# Patient Record
Sex: Male | Born: 1955
Health system: Southern US, Community
[De-identification: ages and names within clinical notes are randomized; demographics above are authoritative.]

## PROBLEM LIST (undated history)

## (undated) DIAGNOSIS — I10 Essential (primary) hypertension: Secondary | ICD-10-CM

## (undated) DIAGNOSIS — E785 Hyperlipidemia, unspecified: Secondary | ICD-10-CM

## (undated) HISTORY — DX: Hyperlipidemia, unspecified: E78.5

## (undated) HISTORY — DX: Essential (primary) hypertension: I10

## (undated) HISTORY — PX: OTHER SURGICAL HISTORY: SHX169

---

## 2005-08-24 ENCOUNTER — Emergency Department (HOSPITAL_COMMUNITY): Admission: EM | Admit: 2005-08-24 | Discharge: 2005-08-24 | Payer: Self-pay | Admitting: Emergency Medicine

## 2010-10-18 ENCOUNTER — Inpatient Hospital Stay (INDEPENDENT_AMBULATORY_CARE_PROVIDER_SITE_OTHER)
Admission: RE | Admit: 2010-10-18 | Discharge: 2010-10-18 | Disposition: A | Discharge status: Home / Self Care | Payer: BC Managed Care – PPO | Source: Ambulatory Visit | Attending: Emergency Medicine | Admitting: Emergency Medicine

## 2010-10-18 DIAGNOSIS — B9789 Other viral agents as the cause of diseases classified elsewhere: Secondary | ICD-10-CM

## 2010-10-18 LAB — POCT URINALYSIS DIPSTICK
Bilirubin Urine: NEGATIVE
Glucose, UA: NEGATIVE mg/dL
Nitrite: NEGATIVE
Protein, ur: NEGATIVE mg/dL
Specific Gravity, Urine: <=1.005 (ref 1.005–1.030)
Urobilinogen, UA: 0.2 mg/dL (ref 0.0–1.0)
pH: 6 (ref 5.0–8.0)

## 2011-08-18 ENCOUNTER — Ambulatory Visit: Payer: Self-pay

## 2012-09-15 ENCOUNTER — Emergency Department (HOSPITAL_COMMUNITY)
Admission: EM | Admit: 2012-09-15 | Discharge: 2012-09-15 | Disposition: A | Discharge status: Home / Self Care | Payer: Worker's Compensation | Attending: Emergency Medicine | Admitting: Emergency Medicine

## 2012-09-15 ENCOUNTER — Encounter (HOSPITAL_COMMUNITY): Payer: Self-pay | Admitting: Adult Health

## 2012-09-15 DIAGNOSIS — M7989 Other specified soft tissue disorders: Secondary | ICD-10-CM | POA: Diagnosis present

## 2012-09-15 DIAGNOSIS — M79609 Pain in unspecified limb: Secondary | ICD-10-CM | POA: Diagnosis not present

## 2012-09-15 DIAGNOSIS — R609 Edema, unspecified: Secondary | ICD-10-CM | POA: Diagnosis not present

## 2012-09-15 DIAGNOSIS — F17200 Nicotine dependence, unspecified, uncomplicated: Secondary | ICD-10-CM | POA: Insufficient documentation

## 2012-09-15 DIAGNOSIS — R600 Localized edema: Secondary | ICD-10-CM

## 2012-09-15 MED ORDER — IBUPROFEN 800 MG PO TABS: 800.0000 mg | ORAL_TABLET | Freq: Three times a day (TID) | ORAL | Status: DC

## 2012-09-15 MED ORDER — HYDROCODONE-ACETAMINOPHEN 5-325 MG PO TABS: 1.0000 | ORAL_TABLET | ORAL | Status: DC | PRN

## 2012-09-15 MED ORDER — HYDROCODONE-ACETAMINOPHEN 5-325 MG PO TABS
1.0000 | ORAL_TABLET | Freq: Once | ORAL | Status: DC
  Filled 2012-09-15: qty 1

## 2012-09-15 MED ORDER — ENOXAPARIN SODIUM 80 MG/0.8ML ~~LOC~~ SOLN
1.0000 mg/kg | Freq: Once | SUBCUTANEOUS | Status: AC
  Administered 2012-09-15: 75 mg via SUBCUTANEOUS
  Filled 2012-09-15: qty 1

## 2012-09-15 NOTE — ED Provider Notes (Signed)
History     CSN: [id number]  Arrival date & time 09/15/12  2041   First MD Initiated Contact with Patient 09/15/12 2128      Chief Complaint  Patient presents with  . Leg Pain   HPI  History provided by the patient. Patient is a 57 year old male with no significant PMH who presents with complaints of right leg and calf pain and swelling. Symptoms began earlier today unexpectedly. Denies any injury or trauma. Denies any pain after sudden movements. He denies any popping or other warning symptoms. Pain has continued to increase with swelling throughout the day. Denies similar symptoms previously. Patient is very active works for [employer] and is up and down out of the truck often. He has not had any recent long trips. Denies previous history of DVT or PE. He has no known coagulopathy. No known active cancer. He is not on any medications. Pain is described as moderate worse with movements and palpation. No fever, chills or sweats.    History reviewed. No pertinent past medical history.  History reviewed. No pertinent past surgical history.  History reviewed. No pertinent family history.  History  Substance Use Topics  . Smoking status: Current Some Day Smoker    Types: Cigars  . Smokeless tobacco: Not on file  . Alcohol Use: No      Review of Systems  Constitutional: Negative for fever and chills.  Respiratory: Negative for cough, shortness of breath and wheezing.   Cardiovascular: Positive for leg swelling. Negative for chest pain and palpitations.  All other systems reviewed and are negative.    Allergies  Review of patient's allergies indicates no known allergies.  Home Medications  No current outpatient prescriptions on file.  BP 146/87  Pulse 77  Temp 98.3 F (36.8 C) (Oral)  Resp 18  SpO2 98%  Physical Exam  Nursing note and vitals reviewed. Constitutional: He is oriented to person, place, and time. He appears well-developed and well-nourished. No distress.   HENT:  Head: Normocephalic.  Eyes: Conjunctivae normal are normal.  Cardiovascular: Normal rate and regular rhythm.   No murmur heard. Pulmonary/Chest: Effort normal and breath sounds normal. No respiratory distress. He has no wheezes. He has no rales.  Abdominal: Soft.  Musculoskeletal: Normal range of motion. He exhibits edema and tenderness.       Unilateral swelling and tenderness the right calf. No erythema of the skin. No significant swelling to the ankle or foot. Normal distal pulses and sensation in the foot. Normal range of motion in all joints. Positive Holmans test  Neurological: He is alert and oriented to person, place, and time.  Skin: Skin is warm.  Psychiatric: He has a normal mood and affect. His behavior is normal.    ED Course  Procedures      1. Edema of right lower extremity       MDM  9:30PM patient seen and evaluated. Patient well-appearing in no acute distress. Denies chest pain, shortness of breath, hemoptysis, pleuritic pain. He has unremarkable vital signs. Leg swelling is concerning for possible DVT. We'll give dose of Lovenox and schedule outpatient Doppler ultrasound tomorrow. Patient agrees with this plan.        Angus Seller, [place] 09/15/12 2252

## 2012-09-15 NOTE — ED Notes (Signed)
Pt states understanding of discharge instructions and the need to return for doppler study

## 2012-09-15 NOTE — ED Notes (Signed)
Presents with left sided leg pain described as soreness, worse with touch and walking. Pain began today associated with edema, no redness or warmth.  Pain has worsened throughout the day.

## 2012-09-16 ENCOUNTER — Encounter (HOSPITAL_COMMUNITY): Payer: Self-pay | Admitting: Vascular Surgery

## 2012-09-16 ENCOUNTER — Other Ambulatory Visit (HOSPITAL_COMMUNITY): Payer: Self-pay | Admitting: Emergency Medicine

## 2012-09-16 ENCOUNTER — Ambulatory Visit (HOSPITAL_COMMUNITY)
Admission: RE | Admit: 2012-09-16 | Discharge: 2012-09-16 | Disposition: A | Discharge status: Home / Self Care | Payer: Worker's Compensation | Source: Ambulatory Visit | Attending: Emergency Medicine | Admitting: Emergency Medicine

## 2012-09-16 DIAGNOSIS — M79609 Pain in unspecified limb: Secondary | ICD-10-CM | POA: Insufficient documentation

## 2012-09-16 DIAGNOSIS — M7989 Other specified soft tissue disorders: Secondary | ICD-10-CM | POA: Insufficient documentation

## 2012-09-16 DIAGNOSIS — M79606 Pain in leg, unspecified: Secondary | ICD-10-CM

## 2012-09-16 NOTE — Progress Notes (Signed)
*  PRELIMINARY RESULTS* Vascular Ultrasound Right lower extremity venous duplex has been completed.  Preliminary findings: Right:  No evidence of DVT or Baker's cyst.  The right calf muscle appears heterogenous with anechoic areas. Possibly a muscle tear.   Farrel Demark, RDMS, RVT 09/16/2012, 9:36 AM

## 2012-09-16 NOTE — ED Provider Notes (Signed)
Medical screening examination/treatment/procedure(s) were performed by non-physician practitioner and as supervising physician I was immediately available for consultation/collaboration.  Juliet Rude. Rubin Payor, MD 09/16/12 0010

## 2016-01-19 ENCOUNTER — Ambulatory Visit (INDEPENDENT_AMBULATORY_CARE_PROVIDER_SITE_OTHER): Payer: BLUE CROSS/BLUE SHIELD

## 2016-01-19 ENCOUNTER — Ambulatory Visit (INDEPENDENT_AMBULATORY_CARE_PROVIDER_SITE_OTHER): Payer: BLUE CROSS/BLUE SHIELD | Admitting: Osteopathic Medicine

## 2016-01-19 ENCOUNTER — Ambulatory Visit: Admission: RE | Admit: 2016-01-19 | Payer: Self-pay | Source: Ambulatory Visit

## 2016-01-19 VITALS — BP 122/74 | HR 88 | Temp 97.9°F | Resp 18 | Ht 74.75 in | Wt 172.0 lb

## 2016-01-19 DIAGNOSIS — M79672 Pain in left foot: Secondary | ICD-10-CM | POA: Diagnosis not present

## 2016-01-19 DIAGNOSIS — M722 Plantar fascial fibromatosis: Secondary | ICD-10-CM | POA: Diagnosis not present

## 2016-01-19 DIAGNOSIS — S93699A Other sprain of unspecified foot, initial encounter: Secondary | ICD-10-CM | POA: Insufficient documentation

## 2016-01-19 MED ORDER — TRAMADOL HCL 50 MG PO TABS: ORAL_TABLET | ORAL | Status: DC

## 2016-01-19 MED ORDER — KETOROLAC TROMETHAMINE 60 MG/2ML IM SOLN
60.0000 mg | Freq: Once | INTRAMUSCULAR | Status: AC
  Administered 2016-01-19: 60 mg via INTRAMUSCULAR

## 2016-01-19 MED ORDER — MELOXICAM 7.5 MG PO TABS
7.5000 mg | ORAL_TABLET | Freq: Every day | ORAL | Status: DC
Start: 2016-01-19 — End: 2016-04-21

## 2016-01-19 NOTE — Progress Notes (Signed)
HPI: Jordan Coffey is a 60 y.o. male who presents to [facility] today for chief complaint of:  Chief Complaint  Patient presents with  . Foot Pain    Left, x 1 day playing basketball     . Location: Left foot at the bottom of the heel . Quality: Soreness, sharp . Duration: About 1 day . Context: After landing in pivoting on this foot while playing basketball, experienced sharp pain in the area noted above. Pain has not improved. No previous history of injury although the patient states that he does have chronic foot pain/plantar fascia pain, previously worked with [employer] and job requires a lot of walking and strain on the feet      Past medical, social and family history reviewed: No past medical history on file. No past surgical history on file. Social History  Substance Use Topics  . Smoking status: Current Some Day Smoker    Types: Cigars  . Smokeless tobacco: Not on file  . Alcohol Use: No   Family History  Problem Relation Age of Onset  . Diabetes Mother   . Hypertension Mother     Current Outpatient Prescriptions  Medication Sig Dispense Refill  . HYDROcodone-acetaminophen (NORCO) 5-325 MG per tablet Take 1-2 tablets by mouth every 4 (four) hours as needed for pain. (Patient not taking: Reported on 01/19/2016) 20 tablet 0  . ibuprofen (ADVIL,MOTRIN) 800 MG tablet Take 1 tablet (800 mg total) by mouth 3 (three) times daily. (Patient not taking: Reported on 01/19/2016) 21 tablet 0   No current facility-administered medications for this visit.   No Known Allergies    Review of Systems: CONSTITUTIONAL:  No recent illness HEAD/EYES/EARS/NOSE/THROAT: No  headache, no vision change CARDIAC: No  chest pain MUSCULOSKELETAL: (+) Left foot pain at the heel SKIN: No  rash/wounds/concerning lesions NEUROLOGIC: No  weakness, No  dizziness  Exam:  BP 122/74 mmHg  Pulse 88  Temp(Src) 97.9 F (36.6 C) (Oral)  Resp 18  Ht 6' 2.75" (1.899 m)  Wt  172 lb (78.019 kg)  BMI 21.63 kg/m2  SpO2 97% Constitutional: VS see above. General Appearance: alert, well-developed, well-nourished, NAD Respiratory: Normal respiratory effort.  Musculoskeletal: Gait mild antalgic favoring left but. No clubbing/cyanosis of digits. Normal passive range of motion of toes and ankle on the left, the patient reports pain with active dorsiflexion. Anterior drawer test negative Skin: warm, dry, intact. No rash/ulcer.    Dg Foot Complete Left  01/19/2016  CLINICAL DATA:  Basketball injury last night with pain. EXAM: LEFT FOOT - COMPLETE 3+ VIEW COMPARISON:  None. FINDINGS: Mild hallux valgus deformity. Small calcaneal spur. No acute fracture or dislocation. IMPRESSION: No acute osseous abnormality. Electronically Signed   By: Jeronimo Greaves M.D.   On: 01/19/2016 16:41     Personal review of the x-ray of the foot reveals calcaneal bone spur/calcification of plantar fascia aponeurosis on calcaneus, mild dislocation of the calcaneus on the navicular, uncertain whether this is a chronic issue but patient's pain is not located in this area. Patient decided to leave the clinic prior to official radiology report, will call if anything changes plan   ASSESSMENT/PLAN: Home rehabilitation exercises were given to the patient with printed instructions, declines physical therapy referral at this time but will consider this if pain is not improving over the next week or so. Postsurgical she was given to the patient as well, if no improvement will consider referral to sports medicine orthopedics as well  as physical therapy  Pain in left foot - Plan: DG Foot Complete Left, ketorolac (TORADOL) injection 60 mg, meloxicam (MOBIC) 7.5 MG tablet, traMADol (ULTRAM) 50 MG tablet, DG Foot Complete Left, CANCELED: DG Foot Complete Left, CANCELED: DG Foot Complete Left  Traumatic plantar fasciitis   Visit summary printed and instructions reviewed with the patient. All questions answered. Return  or call us if symptoms worsen or fail to improve - we can refer to physical therapy or sports med.

## 2016-01-19 NOTE — Patient Instructions (Addendum)

## 2016-02-15 ENCOUNTER — Other Ambulatory Visit: Payer: Self-pay | Admitting: Osteopathic Medicine

## 2016-04-16 ENCOUNTER — Emergency Department (HOSPITAL_COMMUNITY): Payer: BLUE CROSS/BLUE SHIELD

## 2016-04-16 ENCOUNTER — Encounter (HOSPITAL_COMMUNITY): Payer: Self-pay | Admitting: *Deleted

## 2016-04-16 ENCOUNTER — Emergency Department (HOSPITAL_COMMUNITY)
Admission: EM | Admit: 2016-04-16 | Discharge: 2016-04-16 | Disposition: A | Discharge status: Home / Self Care | Payer: BLUE CROSS/BLUE SHIELD | Attending: Emergency Medicine | Admitting: Emergency Medicine

## 2016-04-16 DIAGNOSIS — R55 Syncope and collapse: Secondary | ICD-10-CM | POA: Diagnosis present

## 2016-04-16 LAB — URINALYSIS, ROUTINE W REFLEX MICROSCOPIC
Bilirubin Urine: NEGATIVE
Glucose, UA: NEGATIVE mg/dL
Hgb urine dipstick: NEGATIVE
Ketones, ur: NEGATIVE mg/dL
LEUKOCYTES UA: NEGATIVE
NITRITE: NEGATIVE
PH: 5.5 (ref 5.0–8.0)
Protein, ur: NEGATIVE mg/dL
SPECIFIC GRAVITY, URINE: 1.02 (ref 1.005–1.030)

## 2016-04-16 LAB — CBC
HEMATOCRIT: 44.7 % (ref 39.0–52.0)
HEMOGLOBIN: 15.3 g/dL (ref 13.0–17.0)
MCH: 32.1 pg (ref 26.0–34.0)
MCHC: 34.2 g/dL (ref 30.0–36.0)
MCV: 93.9 fL (ref 78.0–100.0)
Platelets: 173 10*3/uL (ref 150–400)
RBC: 4.76 MIL/uL (ref 4.22–5.81)
RDW: 14.3 % (ref 11.5–15.5)
WBC: 10.9 10*3/uL — AB (ref 4.0–10.5)

## 2016-04-16 LAB — RAPID URINE DRUG SCREEN, HOSP PERFORMED
AMPHETAMINES: NOT DETECTED
BENZODIAZEPINES: NOT DETECTED
Barbiturates: NOT DETECTED
COCAINE: NOT DETECTED
OPIATES: NOT DETECTED
Tetrahydrocannabinol: POSITIVE — AB

## 2016-04-16 LAB — BASIC METABOLIC PANEL
ANION GAP: 8 (ref 5–15)
BUN: 9 mg/dL (ref 6–20)
CHLORIDE: 104 mmol/L (ref 101–111)
CO2: 25 mmol/L (ref 22–32)
Calcium: 9.3 mg/dL (ref 8.9–10.3)
Creatinine, Ser: 1.1 mg/dL (ref 0.61–1.24)
GFR calc Af Amer: >60 mL/min (ref >60)
Glucose, Bld: 138 mg/dL — ABNORMAL HIGH (ref 65–99)
POTASSIUM: 3.4 mmol/L — AB (ref 3.5–5.1)
SODIUM: 137 mmol/L (ref 135–145)

## 2016-04-16 LAB — CBG MONITORING, ED: Glucose-Capillary: 119 mg/dL — ABNORMAL HIGH (ref 65–99)

## 2016-04-16 MED ORDER — SODIUM CHLORIDE 0.9 % IV BOLUS (SEPSIS)
1000.0000 mL | Freq: Once | INTRAVENOUS | Status: AC
  Administered 2016-04-16: 1000 mL via INTRAVENOUS

## 2016-04-16 NOTE — ED Notes (Signed)
Patient transported to X-ray 

## 2016-04-16 NOTE — ED Notes (Signed)
EDP at bedside to discuss plan of care/disposition

## 2016-04-16 NOTE — ED Provider Notes (Signed)
   Date: 04/16/16  Rate: 93  Rhythm: normal sinus rhythm  QRS Axis: right  PR and QT Intervals: normal  ST/T Wave abnormalities: normal  PR and QRS Conduction Disutrbances:none  Narrative Interpretation:   Old EKG Reviewed: none available  Medical screening examination/treatment/procedure(s) were performed by non-physician practitioner and as supervising physician I was immediately available for consultation/collaboration.   EKG Interpretation None        Face-to-face evaluation   History: Patient was standing in the kitchen when he suddenly felt weak and fell forward onto his knees. His wife came to his side and felt that she could not get him to respond. He remembers her talking to him. He was not injured. Patient's wife states he came out of it quickly. She felt he may have been shaking a little bit but did not think he had a seizure. He did not have a prolonged postictal state.  Physical exam: Alert, calm, cooperative. Heart regular in rhythm. No murmur. Lungs clear anteriorly. He moves arms and legs normally. No Dysarthria or aphasia.  Medical screening examination/treatment/procedure(s) were conducted as a shared visit with non-physician practitioner(s) and myself.  I personally evaluated the patient during the encounter   Mancel BaleElliott Edee Nifong, MD 05/11/16 1025

## 2016-04-16 NOTE — ED Triage Notes (Signed)
PT arrives via EMS from home. He was walking to the kitchen, fell to his knees, stood back up then fell back to the ground, wife reported LOC for about 30 sec, did not hit his head. On arrival to scene by EMS, he was alert/oriented able to stand up and walk to the couch. Initially hypertensive (202/100), improved to (175/105) en route. Did c/o headache earlier today, took tylenol for the same.

## 2016-04-21 ENCOUNTER — Encounter: Payer: Self-pay | Admitting: Family Medicine

## 2016-04-21 ENCOUNTER — Ambulatory Visit (INDEPENDENT_AMBULATORY_CARE_PROVIDER_SITE_OTHER): Payer: BLUE CROSS/BLUE SHIELD | Admitting: Family Medicine

## 2016-04-21 VITALS — BP 141/100 | HR 93 | Temp 98.6°F | Ht 75.0 in | Wt 161.0 lb

## 2016-04-21 DIAGNOSIS — I10 Essential (primary) hypertension: Secondary | ICD-10-CM | POA: Insufficient documentation

## 2016-04-21 DIAGNOSIS — R55 Syncope and collapse: Secondary | ICD-10-CM | POA: Diagnosis not present

## 2016-04-21 DIAGNOSIS — Z23 Encounter for immunization: Secondary | ICD-10-CM

## 2016-04-21 DIAGNOSIS — N400 Enlarged prostate without lower urinary tract symptoms: Secondary | ICD-10-CM | POA: Insufficient documentation

## 2016-04-21 DIAGNOSIS — R9431 Abnormal electrocardiogram [ECG] [EKG]: Secondary | ICD-10-CM

## 2016-04-21 DIAGNOSIS — R350 Frequency of micturition: Secondary | ICD-10-CM

## 2016-04-21 LAB — POCT URINALYSIS DIPSTICK
BILIRUBIN UA: NEGATIVE
Glucose, UA: NEGATIVE
Ketones, UA: NEGATIVE
Leukocytes, UA: NEGATIVE
NITRITE UA: NEGATIVE
PH UA: 6
PROTEIN UA: NEGATIVE
Spec Grav, UA: 1.02
UROBILINOGEN UA: 0.2

## 2016-04-21 MED ORDER — LISINOPRIL 10 MG PO TABS: 10.0000 mg | ORAL_TABLET | Freq: Every day | ORAL | 1 refills | Status: DC

## 2016-04-21 MED ORDER — TAMSULOSIN HCL 0.4 MG PO CAPS: 0.4000 mg | ORAL_CAPSULE | Freq: Every day | ORAL | 3 refills | Status: DC

## 2016-04-21 NOTE — Progress Notes (Signed)
Jordan Coffey is a 60 y.o. male who presents to [facility]: Primary Care Sports Medicine today for establish care and discuss hypertension syncope and urinary frequency hypertension.  Syncope: Patient was recently seen in the emergency department for a syncopal event that occurred at home. He slowly for the ground while standing. He denies chest pain or palpitations preceding the syncopal event. In the emergency department an EKG was obtained which showed right axis deviation and a short PR interval.  Since the syncopal event he's been doing well with no further episodes of palpitations chest pains or syncope. His wife denies any seizure-like activity and the patient denies any bowel or bladder incontinence. He did not hit his head.  Hypertension: Patient has been told that he has elevated blood pressure at various doctor visits in the past. As noted above no chest pain palpitations or shortness of breath. He feels well currently.  Urinary frequency and retention. Patient notes difficulty starting urination multiple episodes of nocturnal urination, and difficulty with weak stream. He denies any pain with urination or blood in urine.  AUA symptom score:  16/35: Moderate Quality-of-life score: 5/6: Unhappy   Past Medical History:  Diagnosis Date  . Hypertension    History reviewed. No pertinent surgical history. Social History  Substance Use Topics  . Smoking status: Current Some Day Smoker    Types: Cigars  . Smokeless tobacco: Current User  . Alcohol use No   family history includes Diabetes in his mother; Hypertension in his mother.  ROS as above: No headache, visual changes, nausea, vomiting, diarrhea, constipation, dizziness, abdominal pain, skin rash, fevers, chills, night sweats, weight loss, swollen lymph nodes, body aches, joint swelling, muscle aches, chest pain, shortness of breath,  mood changes, visual or auditory hallucinations.    Medications: Current Outpatient Prescriptions  Medication Sig Dispense Refill  . Aspirin-Salicylamide-Caffeine (BC HEADACHE PO) Take 1 packet by mouth daily as needed. For headache    . ibuprofen (ADVIL,MOTRIN) 200 MG tablet Take 200 mg by mouth every 6 (six) hours as needed.    . methocarbamol (ROBAXIN) 500 MG tablet Take 500 mg by mouth 4 (four) times daily.    . Multiple Vitamins-Minerals (MULTIVITAMIN ADULT PO) Take by mouth.    . naphazoline-pheniramine (NAPHCON-A) 0.025-0.3 % ophthalmic solution Place 1 drop into both eyes 4 (four) times daily as needed for irritation.    Marland Kitchen lisinopril (PRINIVIL,ZESTRIL) 10 MG tablet Take 1 tablet (10 mg total) by mouth daily. 30 tablet 1  . tamsulosin (FLOMAX) 0.4 MG CAPS capsule Take 1 capsule (0.4 mg total) by mouth daily. 30 capsule 3   No current facility-administered medications for this visit.    No Known Allergies   Exam:  BP (!) 141/100   Pulse 93   Temp 98.6 F (37 C) (Oral)   Ht 6\' 3"  (1.905 m)   Wt 161 lb (73 kg)   SpO2 100%   BMI 20.12 kg/m  Gen: Well NAD HEENT: EOMI,  MMM Lungs: Normal work of breathing. CTABL Heart: RRR no MRG Abd: NABS, Soft. Nondistended, Nontender Exts: Brisk capillary refill, warm and well perfused. Rectal exam: Prostate is enlarged without nodules. Nontender.     Results for orders placed or performed in visit on 04/21/16 (from the past 24 hour(s))  Urinalysis, Routine w reflex microscopic     Status: None   Collection Time: 04/21/16  3:13 PM  Result Value Ref Range   Color, Urine YELLOW YELLOW  APPearance CLEAR CLEAR   Specific Gravity, Urine 1.017 1.001 - 1.035   pH 6.0 5.0 - 8.0   Glucose, UA NEGATIVE NEGATIVE   Bilirubin Urine NEGATIVE NEGATIVE   Ketones, ur NEGATIVE NEGATIVE   Hgb urine dipstick NEGATIVE NEGATIVE   Protein, ur NEGATIVE NEGATIVE   Nitrite NEGATIVE NEGATIVE   Leukocytes, UA NEGATIVE NEGATIVE   Narrative   Performed  at:  [facility]                [address], [address]                [place], [place] [id number]  POCT Urinalysis Dipstick     Status: Abnormal   Collection Time: 04/21/16  5:08 PM  Result Value Ref Range   Color, UA yellow    Clarity, UA clear    Glucose, UA neg    Bilirubin, UA neg    Ketones, UA neg    Spec Grav, UA 1.020    Blood, UA trace lysed    pH, UA 6.0    Protein, UA neg    Urobilinogen, UA 0.2    Nitrite, UA neg    Leukocytes, UA Negative Negative   No results found.    Assessment and Plan: 60 y.o. male with  Syncopal event: Unclear etiology. Concerning for cardiogenic syncope. Patient does have a shortened PR interval. Plan for echocardiogram and fasting laboratory workup. Patient likely will benefit from Holter monitor possible stress testing as well. Refer to cardiology. Return in one month.  Hypertension: Start lisinopril. Check fasting labs. Return in one month  BPH: Likely based on symptoms and prostate exam. PSA pending. Treatment with Flomax. Recheck in one month.  Tdap and influenza vaccine given prior to discharge.  Orders Placed This Encounter  Procedures  . Urine Culture  . Tdap vaccine greater than or equal to 7yo IM  . Flu Vaccine QUAD 36+ mos IM  . Urinalysis, Routine w reflex microscopic  . CBC  . Comprehensive metabolic panel    Order Specific Question:   Has the patient fasted?    Answer:   No  . Lipid panel    Order Specific Question:   Has the patient fasted?    Answer:   No  . Hemoglobin A1c  . TSH  . HIV antibody  . PSA  . POCT Urinalysis Dipstick  . ECHOCARDIOGRAM COMPLETE    Standing Status:   Future    Standing Expiration Date:   07/21/2017    Order Specific Question:   Where should this test be performed    Answer:   [facility]    Order Specific Question:   Complete or Limited study?    Answer:   Complete    Order Specific Question:   Does the patient have a known history of hypersensitivity to Perflutren  (aka Hospital doctorDefinity-Image Enhancing Agent for echocardiograms - CHECK ALLERGIES)    Answer:   No    Order Specific Question:   ADMINISTER PERFLUTERN    Answer:   ADMINISTER PERFLUTREN    Order Specific Question:   Expected Date:    Answer:   1 month    Order Specific Question:   Reason for exam-Echo    Answer:   Syncope  780.2 / R55    Discussed warning signs or symptoms. Please see discharge instructions. Patient expresses understanding.

## 2016-04-21 NOTE — Patient Instructions (Signed)
Thank you for coming in today. You should hear about the hear ultrasound soon.  Get fasting labs soon.  Start lisinopril for blood pressure  Start Tamsulosin for urination.  Return in 1 month.  Call or go to the emergency room if you get worse, have trouble breathing, have chest pains, or palpitations.     Lisinopril tablets What is this medicine? LISINOPRIL (lyse IN oh pril) is an ACE inhibitor. This medicine is used to treat high blood pressure and heart failure. It is also used to protect the heart immediately after a heart attack. This medicine may be used for other purposes; ask your health care provider or pharmacist if you have questions. What should I tell my health care provider before I take this medicine? They need to know if you have any of these conditions: -diabetes -heart or blood vessel disease -kidney disease -low blood pressure -previous swelling of the tongue, face, or lips with difficulty breathing, difficulty swallowing, hoarseness, or tightening of the throat -an unusual or allergic reaction to lisinopril, other ACE inhibitors, insect venom, foods, dyes, or preservatives -pregnant or trying to get pregnant -breast-feeding How should I use this medicine? Take this medicine by mouth with a glass of water. Follow the directions on your prescription label. You may take this medicine with or without food. If it upsets your stomach, take it with food. Take your medicine at regular intervals. Do not take it more often than directed. Do not stop taking except on your doctor's advice. Talk to your pediatrician regarding the use of this medicine in children. Special care may be needed. While this drug may be prescribed for children as young as 556 years of age for selected conditions, precautions do apply. Overdosage: If you think you have taken too much of this medicine contact a poison control center or emergency room at once. NOTE: This medicine is only for you. Do not share  this medicine with others. What if I miss a dose? If you miss a dose, take it as soon as you can. If it is almost time for your next dose, take only that dose. Do not take double or extra doses. What may interact with this medicine? Do not take this medicine with any of the following medications: -hymenoptera venomThis medicines may also interact with the following medications: -aliskiren -angiotensin receptor blockers, like losartan or valsartan -certain medicines for diabetes -diuretics -everolimus -gold compounds -lithium -NSAIDs, medicines for pain and inflammation, like ibuprofen or naproxen -potassium salts or supplements -salt substitutes -sirolimus -temsirolimus This list may not describe all possible interactions. Give your health care provider a list of all the medicines, herbs, non-prescription drugs, or dietary supplements you use. Also tell them if you smoke, drink alcohol, or use illegal drugs. Some items may interact with your medicine. What should I watch for while using this medicine? Visit your doctor or health care professional for regular check ups. Check your blood pressure as directed. Ask your doctor what your blood pressure should be, and when you should contact him or her. Do not treat yourself for coughs, colds, or pain while you are using this medicine without asking your doctor or health care professional for advice. Some ingredients may increase your blood pressure. Women should inform their doctor if they wish to become pregnant or think they might be pregnant. There is a potential for serious side effects to an unborn child. Talk to your health care professional or pharmacist for more information. Check with your doctor or  health care professional if you get an attack of severe diarrhea, nausea and vomiting, or if you sweat a lot. The loss of too much body fluid can make it dangerous for you to take this medicine. You may get drowsy or dizzy. Do not drive, use  machinery, or do anything that needs mental alertness until you know how this drug affects you. Do not stand or sit up quickly, especially if you are an older patient. This reduces the risk of dizzy or fainting spells. Alcohol can make you more drowsy and dizzy. Avoid alcoholic drinks. Avoid salt substitutes unless you are told otherwise by your doctor or health care professional. What side effects may I notice from receiving this medicine? Side effects that you should report to your doctor or health care professional as soon as possible: -allergic reactions like skin rash, itching or hives, swelling of the hands, feet, face, lips, throat, or tongue -breathing problems -signs and symptoms of kidney injury like trouble passing urine or change in the amount of urine -signs and symptoms of increased potassium like muscle weakness; chest pain; or fast, irregular heartbeat -signs and symptoms of liver injury like dark yellow or brown urine; general ill feeling or flu-like symptoms; light-colored stools; loss of appetite; nausea; right upper belly pain; unusually weak or tired; yellowing of the eyes or skin -signs and symptoms of low blood pressure like dizziness; feeling faint or lightheaded, falls; unusually weak or tired -stomach pain with or without nausea and vomiting Side effects that usually do not require medical attention (report to your doctor or health care professional if they continue or are bothersome): -changes in taste -cough -dizziness -fever -headache -sensitivity to light This list may not describe all possible side effects. Call your doctor for medical advice about side effects. You may report side effects to FDA at 1-800-FDA-1088. Where should I keep my medicine? Keep out of the reach of children. Store at room temperature between 15 and 30 degrees C (59 and 86 degrees F). Protect from moisture. Keep container tightly closed. Throw away any unused medicine after the expiration  date. NOTE: This sheet is a summary. It may not cover all possible information. If you have questions about this medicine, talk to your doctor, pharmacist, or health care provider.    2016, Elsevier/Gold Standard. (2015-03-22 20:38:20)   Tamsulosin capsules What is this medicine? TAMSULOSIN (tam SOO loe sin) is used to treat enlargement of the prostate gland in men, a condition called benign prostatic hyperplasia or BPH. It is not for use in women. It works by relaxing muscles in the prostate and bladder neck. This improves urine flow and reduces BPH symptoms. This medicine may be used for other purposes; ask your health care provider or pharmacist if you have questions. What should I tell my health care provider before I take this medicine? They need to know if you have any of the following conditions: -advanced kidney disease -advanced liver disease -low blood pressure -prostate cancer -an unusual or allergic reaction to tamsulosin, sulfa drugs, other medicines, foods, dyes, or preservatives -pregnant or trying to get pregnant -breast-feeding How should I use this medicine? Take this medicine by mouth about 30 minutes after the same meal every day. Follow the directions on the prescription label. Swallow the capsules whole with a glass of water. Do not crush, chew, or open capsules. Do not take your medicine more often than directed. Do not stop taking your medicine unless your doctor tells you to. Talk to your  pediatrician regarding the use of this medicine in children. Special care may be needed. Overdosage: If you think you have taken too much of this medicine contact a poison control center or emergency room at once. NOTE: This medicine is only for you. Do not share this medicine with others. What if I miss a dose? If you miss a dose, take it as soon as you can. If it is almost time for your next dose, take only that dose. Do not take double or extra doses. If you stop taking your  medicine for several days or more, ask your doctor or health care professional what dose you should start back on. What may interact with this medicine? -cimetidine -fluoxetine -ketoconazole -medicines for erectile disfunction like sildenafil, tadalafil, vardenafil -medicines for high blood pressure -other alpha-blockers like alfuzosin, doxazosin, phentolamine, phenoxybenzamine, prazosin, terazosin -warfarin This list may not describe all possible interactions. Give your health care provider a list of all the medicines, herbs, non-prescription drugs, or dietary supplements you use. Also tell them if you smoke, drink alcohol, or use illegal drugs. Some items may interact with your medicine. What should I watch for while using this medicine? Visit your doctor or health care professional for regular check ups. You will need lab work done before you start this medicine and regularly while you are taking it. Check your blood pressure as directed. Ask your health care professional what your blood pressure should be, and when you should contact him or her. This medicine may make you feel dizzy or lightheaded. This is more likely to happen after the first dose, after an increase in dose, or during hot weather or exercise. Drinking alcohol and taking some medicines can make this worse. Do not drive, use machinery, or do anything that needs mental alertness until you know how this medicine affects you. Do not sit or stand up quickly. If you begin to feel dizzy, sit down until you feel better. These effects can decrease once your body adjusts to the medicine. Contact your doctor or health care professional right away if you have an erection that lasts longer than 4 hours or if it becomes painful. This may be a sign of a serious problem and must be treated right away to prevent permanent damage. If you are thinking of having cataract surgery, tell your eye surgeon that you have taken this medicine. What side  effects may I notice from receiving this medicine? Side effects that you should report to your doctor or health care professional as soon as possible: -allergic reactions like skin rash or itching, hives, swelling of the lips, mouth, tongue, or throat -breathing problems -change in vision -feeling faint or lightheaded -irregular heartbeat -prolonged or painful erection -weakness Side effects that usually do not require medical attention (report to your doctor or health care professional if they continue or are bothersome): -back pain -change in sex drive or performance -constipation, nausea or vomiting -cough -drowsy -runny or stuffy nose -trouble sleeping This list may not describe all possible side effects. Call your doctor for medical advice about side effects. You may report side effects to FDA at 1-800-FDA-1088. Where should I keep my medicine? Keep out of the reach of children. Store at room temperature between 15 and 30 degrees C (59 and 86 degrees F). Throw away any unused medicine after the expiration date. NOTE: This sheet is a summary. It may not cover all possible information. If you have questions about this medicine, talk to your doctor, pharmacist, or  health care provider.    2016, Elsevier/Gold Standard. (2012-07-28 14:11:34)

## 2016-04-22 ENCOUNTER — Other Ambulatory Visit: Payer: Self-pay | Admitting: Family Medicine

## 2016-04-22 ENCOUNTER — Encounter: Payer: Self-pay | Admitting: Family Medicine

## 2016-04-22 DIAGNOSIS — R9431 Abnormal electrocardiogram [ECG] [EKG]: Secondary | ICD-10-CM | POA: Insufficient documentation

## 2016-04-22 LAB — URINALYSIS, ROUTINE W REFLEX MICROSCOPIC
Bilirubin Urine: NEGATIVE
Glucose, UA: NEGATIVE
HGB URINE DIPSTICK: NEGATIVE
Ketones, ur: NEGATIVE
LEUKOCYTES UA: NEGATIVE
NITRITE: NEGATIVE
PROTEIN: NEGATIVE
Specific Gravity, Urine: 1.017 (ref 1.001–1.035)
pH: 6 (ref 5.0–8.0)

## 2016-04-22 LAB — CBC
HCT: 46 % (ref 38.5–50.0)
Hemoglobin: 15.9 g/dL (ref 13.2–17.1)
MCH: 32.1 pg (ref 27.0–33.0)
MCHC: 34.6 g/dL (ref 32.0–36.0)
MCV: 92.9 fL (ref 80.0–100.0)
MPV: 10.6 fL (ref 7.5–12.5)
PLATELETS: 186 10*3/uL (ref 140–400)
RBC: 4.95 MIL/uL (ref 4.20–5.80)
RDW: 14.6 % (ref 11.0–15.0)
WBC: 6.5 10*3/uL (ref 3.8–10.8)

## 2016-04-22 LAB — COMPREHENSIVE METABOLIC PANEL
ALT: 17 U/L (ref 9–46)
AST: 17 U/L (ref 10–35)
Albumin: 4.2 g/dL (ref 3.6–5.1)
Alkaline Phosphatase: 63 U/L (ref 40–115)
BUN: 14 mg/dL (ref 7–25)
CHLORIDE: 102 mmol/L (ref 98–110)
CO2: 28 mmol/L (ref 20–31)
CREATININE: 0.96 mg/dL (ref 0.70–1.33)
Calcium: 9.7 mg/dL (ref 8.6–10.3)
GLUCOSE: 109 mg/dL — AB (ref 65–99)
Potassium: 4.5 mmol/L (ref 3.5–5.3)
SODIUM: 137 mmol/L (ref 135–146)
TOTAL PROTEIN: 7.4 g/dL (ref 6.1–8.1)
Total Bilirubin: 0.5 mg/dL (ref 0.2–1.2)

## 2016-04-22 LAB — LIPID PANEL
CHOLESTEROL: 126 mg/dL (ref 125–200)
HDL: 71 mg/dL (ref >=40)
LDL CALC: 37 mg/dL (ref <130)
Total CHOL/HDL Ratio: 1.8 Ratio (ref <=5.0)
Triglycerides: 90 mg/dL (ref <150)
VLDL: 18 mg/dL (ref <30)

## 2016-04-23 LAB — URINALYSIS, ROUTINE W REFLEX MICROSCOPIC
Bilirubin Urine: NEGATIVE
GLUCOSE, UA: NEGATIVE
HGB URINE DIPSTICK: NEGATIVE
Ketones, ur: NEGATIVE
LEUKOCYTES UA: NEGATIVE
NITRITE: NEGATIVE
PROTEIN: NEGATIVE
Specific Gravity, Urine: 1.02 (ref 1.001–1.035)
pH: 6 (ref 5.0–8.0)

## 2016-04-23 LAB — PSA: PSA: 0.5 ng/mL (ref <=4.0)

## 2016-04-23 LAB — URINE CULTURE
ORGANISM ID, BACTERIA: NO GROWTH
Organism ID, Bacteria: NO GROWTH

## 2016-04-23 LAB — HEMOGLOBIN A1C
HEMOGLOBIN A1C: 5.2 % (ref <5.7)
MEAN PLASMA GLUCOSE: 103 mg/dL

## 2016-04-23 LAB — TSH: TSH: 2.21 m[IU]/L (ref 0.40–4.50)

## 2016-04-23 LAB — HIV ANTIBODY (ROUTINE TESTING W REFLEX): HIV 1&2 Ab, 4th Generation: NONREACTIVE

## 2016-04-30 ENCOUNTER — Ambulatory Visit (HOSPITAL_BASED_OUTPATIENT_CLINIC_OR_DEPARTMENT_OTHER)
Admission: RE | Admit: 2016-04-30 | Discharge: 2016-04-30 | Disposition: A | Discharge status: Home / Self Care | Payer: BLUE CROSS/BLUE SHIELD | Source: Ambulatory Visit | Attending: Family Medicine | Admitting: Family Medicine

## 2016-04-30 DIAGNOSIS — Z23 Encounter for immunization: Secondary | ICD-10-CM

## 2016-04-30 DIAGNOSIS — I10 Essential (primary) hypertension: Secondary | ICD-10-CM

## 2016-04-30 DIAGNOSIS — Z720 Tobacco use: Secondary | ICD-10-CM | POA: Diagnosis not present

## 2016-04-30 DIAGNOSIS — R55 Syncope and collapse: Secondary | ICD-10-CM | POA: Diagnosis present

## 2016-04-30 DIAGNOSIS — I119 Hypertensive heart disease without heart failure: Secondary | ICD-10-CM | POA: Insufficient documentation

## 2016-04-30 NOTE — Progress Notes (Signed)
  Echocardiogram 2D Echocardiogram has been performed.  Jordan Coffey, Jordan Coffey 04/30/2016, 1:55 PM

## 2016-05-01 ENCOUNTER — Encounter: Payer: Self-pay | Admitting: Family Medicine

## 2016-05-01 ENCOUNTER — Other Ambulatory Visit: Payer: Self-pay

## 2016-05-01 DIAGNOSIS — I5189 Other ill-defined heart diseases: Secondary | ICD-10-CM | POA: Insufficient documentation

## 2016-05-01 DIAGNOSIS — I517 Cardiomegaly: Secondary | ICD-10-CM | POA: Insufficient documentation

## 2016-05-20 NOTE — Progress Notes (Signed)
Referring: Clementeen Graham MD  HPI: 60 yo male for evaluation of near syncope. Seen in ER 04/16/16 with syncope. Recent labs unrevealing with Hgb 15.9, K 4.5 and BUN/Cr 14/0.96. Echo 9/17 showed normal LV systolic function; mild LVH; grade 1 DD. On September 6 the patient was in his home in the kitchen. He was sitting at the table and felt mildly dizzy. He walked to the sink and as he was standing there became lightheaded. No chest pain, palpitations, nausea or dyspnea. He went down to 1 knee and then fell backwards. There was no frank loss of consciousness as he apparently could hear his wife speaking to him but did not respond. After 2 minutes he improved. There was no seizure activity or incontinence. He was seen in the emergency room and given one bag of IV fluids with improvement. He has had no problems since and there is no prior history of syncope. He denies dyspnea on exertion, orthopnea, PND, pedal edema, exertional chest pain or palpitations. Because of the above cardiology is asked to evaluate.  Current Outpatient Prescriptions  Medication Sig Dispense Refill  . ibuprofen (ADVIL,MOTRIN) 200 MG tablet Take 200 mg by mouth every 6 (six) hours as needed.    Marland Kitchen lisinopril (PRINIVIL,ZESTRIL) 10 MG tablet Take 1 tablet (10 mg total) by mouth daily. 30 tablet 1  . Multiple Vitamins-Minerals (MULTIVITAMIN ADULT PO) Take by mouth.    . tamsulosin (FLOMAX) 0.4 MG CAPS capsule Take 1 capsule (0.4 mg total) by mouth daily. 30 capsule 3   No current facility-administered medications for this visit.     No Known Allergies   Past Medical History:  Diagnosis Date  . Hypertension     Past Surgical History:  Procedure Laterality Date  . Arm surgery    . arthroscopic knee surgery      Social History   Social History  . Marital status: Married    Spouse name: N/A  . Number of children: 3  . Years of education: N/A   Occupational History  .      Retired   Social History Main Topics  .  Smoking status: Current Some Day Smoker    Types: Cigars  . Smokeless tobacco: Current User  . Alcohol use Yes     Comment: Occasional  . Drug use: No  . Sexual activity: Not on file   Other Topics Concern  . Not on file   Social History Narrative  . No narrative on file    Family History  Problem Relation Age of Onset  . Diabetes Mother   . Hypertension Mother   . Hypertension Father     ROS: no fevers or chills, productive cough, hemoptysis, dysphasia, odynophagia, melena, hematochezia, dysuria, hematuria, rash, seizure activity, orthopnea, PND, pedal edema, claudication. Remaining systems are negative.  Physical Exam:   Blood pressure (!) 155/100, pulse 93, height 6\' 3"  (1.905 m), weight 167 lb (75.8 kg).  General:  Well developed/well nourished in NAD Skin warm/dry Patient not depressed No peripheral clubbing Back-normal HEENT-normal/normal eyelids Neck supple/normal carotid upstroke bilaterally; no bruits; no JVD; no thyromegaly chest - CTA/ normal expansion CV - RRR/normal S1 and S2; no murmurs, rubs or gallops;  PMI nondisplaced Abdomen -NT/ND, no HSM, no mass, + bowel sounds, no bruit 2+ femoral pulses, no bruits Ext-no edema, chords, 2+ DP Neuro-grossly nonfocal  ECG 04/16/16-Sinus with RAD.   A/P  1 Syncope-etiology unclear. Question orthostatic component with possible vagal contribution. His LV function is normal  and electrocardiogram not diagnostic. He's had no symptoms since. I will not pursue further evaluation at this point. If he has recurrent episodes in the future we will consider a monitor. I instructed him to increase fluid intake.  2 Tobacco abuse-patient counseled on discontinuing.  3 hypertension-patient's blood pressure is elevated today but they state it typically runs in the 130/85 range. I have asked him to track this at home and we will advance his lisinopril as needed.  Olga MillersBrian Crenshaw, MD

## 2016-05-21 ENCOUNTER — Ambulatory Visit (INDEPENDENT_AMBULATORY_CARE_PROVIDER_SITE_OTHER): Payer: BLUE CROSS/BLUE SHIELD | Admitting: Cardiology

## 2016-05-21 ENCOUNTER — Encounter: Payer: Self-pay | Admitting: Cardiology

## 2016-05-21 VITALS — BP 155/100 | HR 93 | Ht 75.0 in | Wt 167.0 lb

## 2016-05-21 DIAGNOSIS — R55 Syncope and collapse: Secondary | ICD-10-CM

## 2016-05-21 DIAGNOSIS — I10 Essential (primary) hypertension: Secondary | ICD-10-CM | POA: Diagnosis not present

## 2016-05-21 NOTE — Patient Instructions (Signed)
Your physician wants you to follow-up in: 6 MONTHS WITH DR CRENSHAW You will receive a reminder letter in the mail two months in advance. If you don't receive a letter, please call our office to schedule the follow-up appointment.  

## 2016-05-22 ENCOUNTER — Encounter: Payer: Self-pay | Admitting: Family Medicine

## 2016-05-22 ENCOUNTER — Ambulatory Visit (INDEPENDENT_AMBULATORY_CARE_PROVIDER_SITE_OTHER): Payer: BLUE CROSS/BLUE SHIELD | Admitting: Family Medicine

## 2016-05-22 VITALS — BP 144/90 | HR 81 | Wt 164.0 lb

## 2016-05-22 DIAGNOSIS — I10 Essential (primary) hypertension: Secondary | ICD-10-CM | POA: Diagnosis not present

## 2016-05-22 DIAGNOSIS — N401 Enlarged prostate with lower urinary tract symptoms: Secondary | ICD-10-CM | POA: Diagnosis not present

## 2016-05-22 DIAGNOSIS — R61 Generalized hyperhidrosis: Secondary | ICD-10-CM

## 2016-05-22 DIAGNOSIS — R350 Frequency of micturition: Secondary | ICD-10-CM

## 2016-05-22 DIAGNOSIS — I517 Cardiomegaly: Secondary | ICD-10-CM

## 2016-05-22 LAB — BASIC METABOLIC PANEL WITH GFR
BUN: 12 mg/dL (ref 7–25)
CHLORIDE: 106 mmol/L (ref 98–110)
CO2: 23 mmol/L (ref 20–31)
Calcium: 9.3 mg/dL (ref 8.6–10.3)
Creat: 0.82 mg/dL (ref 0.70–1.33)
GFR, Est African American: >89 mL/min (ref >=60)
GFR, Est Non African American: >89 mL/min (ref >=60)
Glucose, Bld: 93 mg/dL (ref 65–99)
POTASSIUM: 4.2 mmol/L (ref 3.5–5.3)
SODIUM: 140 mmol/L (ref 135–146)

## 2016-05-22 MED ORDER — LISINOPRIL 20 MG PO TABS: 20.0000 mg | ORAL_TABLET | Freq: Every day | ORAL | 1 refills | Status: DC

## 2016-05-22 MED ORDER — TAMSULOSIN HCL 0.4 MG PO CAPS: 0.4000 mg | ORAL_CAPSULE | Freq: Every day | ORAL | 3 refills | Status: DC

## 2016-05-22 NOTE — Progress Notes (Signed)
Jordan Coffey is a 60 y.o. male who presents to [facility]: [facility] Medicine today for follow-up for hypertension BPH and discussed night sweats.  Hypertension:  Patient was started on lisinopril at the last visit. He feels well with no syncope lightheaded dizziness chest pain or palpitations. Blood pressure measurements have been in the 140s range.  BPH: Patient was presumptively diagnosed with BPH at the last visit. He was started on Flomax and feels much better.  AUASS: 7/35 QOL score: 2/6   Night sweats: Patient notes night sweats present for months. He denies any weight loss fevers chills vomiting diarrhea cough exposure to tuberculosis. He denies any lymph node swelling.   Past Medical History:  Diagnosis Date  . Hypertension    Past Surgical History:  Procedure Laterality Date  . Arm surgery    . arthroscopic knee surgery     Social History  Substance Use Topics  . Smoking status: Current Some Day Smoker    Types: Cigars  . Smokeless tobacco: Current User  . Alcohol use Yes     Comment: Occasional   family history includes Diabetes in his mother; Hypertension in his father and mother.  ROS as above:  Medications: Current Outpatient Prescriptions  Medication Sig Dispense Refill  . ibuprofen (ADVIL,MOTRIN) 200 MG tablet Take 200 mg by mouth every 6 (six) hours as needed.    Marland Kitchen lisinopril (PRINIVIL,ZESTRIL) 20 MG tablet Take 1 tablet (20 mg total) by mouth daily. 30 tablet 1  . Multiple Vitamins-Minerals (MULTIVITAMIN ADULT PO) Take by mouth.    . tamsulosin (FLOMAX) 0.4 MG CAPS capsule Take 1 capsule (0.4 mg total) by mouth daily. 90 capsule 3   No current facility-administered medications for this visit.    No Known Allergies  Health Maintenance Health Maintenance  Topic Date Due  . Hepatitis C Screening  11-12-1955  . COLONOSCOPY  03/15/2008  .  TETANUS/TDAP  04/21/2026  . INFLUENZA VACCINE  Completed  . HIV Screening  Completed     Exam:  BP (!) 144/90   Pulse 81   Wt 164 lb (74.4 kg)   BMI 20.50 kg/m  Gen: Well NAD HEENT: EOMI,  MMM Lungs: Normal work of breathing. CTABL Heart: RRR no MRG Abd: NABS, Soft. Nondistended, Nontender Exts: Brisk capillary refill, warm and well perfused.   CXR 04/16/16 reviewed. No masses.  Lab Results  Component Value Date   WBC 6.5 04/21/2016   HGB 15.9 04/21/2016   HCT 46.0 04/21/2016   MCV 92.9 04/21/2016   PLT 186 04/21/2016     Chemistry      Component Value Date/Time   NA 137 04/21/2016 0805   K 4.5 04/21/2016 0805   CL 102 04/21/2016 0805   CO2 28 04/21/2016 0805   BUN 14 04/21/2016 0805   CREATININE 0.96 04/21/2016 0805      Component Value Date/Time   CALCIUM 9.7 04/21/2016 0805   ALKPHOS 63 04/21/2016 0805   AST 17 04/21/2016 0805   ALT 17 04/21/2016 0805   BILITOT 0.5 04/21/2016 0805        No results found for this or any previous visit (from the past 72 hour(s)). No results found.    Assessment and Plan: 60 y.o. male with  Hypertension: Improved but not ideally controlled. Increase lisinopril to 20 mg check metabolic panel today and recheck blood pressure in one month.  BPH: Much improved. See scanned AUA score. Continue Flomax  Night  sweats: Unclear etiology. Obviously this is concerning for a man in his 1750s who smokes. However recent chest x-ray was unremarkable and he does not have any other concerning signs or symptoms. Plan for watchful waiting.  Orders Placed This Encounter  Procedures  . BASIC METABOLIC PANEL WITH GFR    Discussed warning signs or symptoms. Please see discharge instructions. Patient expresses understanding.

## 2016-05-22 NOTE — Patient Instructions (Signed)
Thank you for coming in today. Get labs today.  Increase lisinopril to 20mg  daily.  Continue flomax.  Recheck in 1 month.

## 2016-06-16 ENCOUNTER — Other Ambulatory Visit: Payer: Self-pay | Admitting: Family Medicine

## 2016-06-16 DIAGNOSIS — I10 Essential (primary) hypertension: Secondary | ICD-10-CM

## 2016-06-19 ENCOUNTER — Ambulatory Visit (INDEPENDENT_AMBULATORY_CARE_PROVIDER_SITE_OTHER): Payer: BLUE CROSS/BLUE SHIELD | Admitting: Family Medicine

## 2016-06-19 ENCOUNTER — Encounter: Payer: Self-pay | Admitting: Family Medicine

## 2016-06-19 VITALS — BP 140/78 | Wt 167.0 lb

## 2016-06-19 DIAGNOSIS — I10 Essential (primary) hypertension: Secondary | ICD-10-CM

## 2016-06-19 DIAGNOSIS — N401 Enlarged prostate with lower urinary tract symptoms: Secondary | ICD-10-CM | POA: Diagnosis not present

## 2016-06-19 DIAGNOSIS — R61 Generalized hyperhidrosis: Secondary | ICD-10-CM | POA: Diagnosis not present

## 2016-06-19 DIAGNOSIS — R350 Frequency of micturition: Secondary | ICD-10-CM

## 2016-06-19 MED ORDER — LISINOPRIL-HYDROCHLOROTHIAZIDE 20-25 MG PO TABS: 1.0000 | ORAL_TABLET | Freq: Every day | ORAL | 0 refills | Status: DC

## 2016-06-19 NOTE — Progress Notes (Signed)
Jordan Coffey is a 60 y.o. male who [facility] today for follow up of HTN, BPH, night sweats, and smoking.  HTN: He is tolerating lisinopril 20 mg well. No syncope, lightheadedness, palpitations, shortness of breath, cough, or lightheadedness. Brought home BP log in today, which shows occasional SBPs>140 and several DBPs>90. He says he has had family stressors in the past month and is aware of BP increases on more stressful days. Does not eat a healthy diet or exercise regularly.  BPH: Doing well on Flomax. Says urgency has improved.  AUASS: 7/35 QOL score: 1/6  Night sweats: Ongoing for months and still present some nights. Has gained weight since last visit. Continues to deny fevers/chills; he has taken his temperature on a few occasions at night, and it has been <100. Negative workup at last visit 04/2016, including labs (CBC, CMP, TSH, PSA, HIV) and CXR.  Smoking: He is down to 1-2 cigarettes a week, which is slightly improved from the last visit. Has been cutting down cold [place] from 1-2 packs a week previously.  Past Medical History:  Diagnosis Date  . Hypertension    Past Surgical History:  Procedure Laterality Date  . Arm surgery    . arthroscopic knee surgery     Social History  Substance Use Topics  . Smoking status: Current Some Day Smoker    Types: Cigars  . Smokeless tobacco: Current User  . Alcohol use Yes     Comment: Occasional   family history includes Diabetes in his mother; Hypertension in his father and mother.  ROS as above:  Medications: Current Outpatient Prescriptions  Medication Sig Dispense Refill  . ibuprofen (ADVIL,MOTRIN) 200 MG tablet Take 200 mg by mouth every 6 (six) hours as needed.    Marland Kitchen. lisinopril (PRINIVIL,ZESTRIL) 20 MG tablet Take 1 tablet (20 mg total) by mouth daily. 30 tablet 1  . Multiple  Vitamins-Minerals (MULTIVITAMIN ADULT PO) Take by mouth.    . tamsulosin (FLOMAX) 0.4 MG CAPS capsule Take 1 capsule (0.4 mg total) by mouth daily. 90 capsule 3  . lisinopril-hydrochlorothiazide (PRINZIDE,ZESTORETIC) 20-25 MG tablet Take 1 tablet by mouth daily. 90 tablet 0   No current facility-administered medications for this visit.    No Known Allergies  Health Maintenance Health Maintenance  Topic Date Due  . Hepatitis C Screening  12-23-1955  . COLONOSCOPY  03/15/2008  . TETANUS/TDAP  04/21/2026  . INFLUENZA VACCINE  Completed  . HIV Screening  Completed     Exam:  BP 140/78   Wt 167 lb (75.8 kg)   BMI 20.87 kg/m  Gen: Well NAD HEENT: EOMI,  MMM Lungs: Normal work of breathing. CTABL Heart: RRR no MRG Abd: NABS, Soft. Nondistended, Nontender Exts: Brisk capillary refill, warm and well perfused.  No cervical axillary or inguinal lymphadenopathy present   No results found for this or any previous visit (from the past 72 hour(s)). No results found.   Assessment and Plan: 60 y.o. male with HTN, BPH, night sweats, and smoking.  HTN: Improved with lisinopril, but BPs still above target. Switch to lisinopril/HCTZ 20-25 mg. Counseled on diet and exercise.  BPH: Well controlled on Flomax; continue.  Night sweats: Ongoing without weight loss or other symptoms, patient well-appearing. No clear source of concern for cancer with recent normal CXR given smoking history and normal PSA. Showed patient negative workup thus far and encouraged him to call back if he gets worse.  Smoking: Commended patient on progress and encouraged him to stay on current trajectory. Check in at next visit.  Follow up in 3 months.   No orders of the defined types were placed in this encounter.   Discussed warning signs or symptoms. Please see discharge instructions. Patient expresses understanding.

## 2016-06-19 NOTE — Patient Instructions (Addendum)
Thank you for coming in today.  Blood pressure is a little elevated.  STOP Lisinopril START lisinopril/HCTZ daily.  Recheck in 3 months.  Keep an eye on night sweats. Return if worse.

## 2016-07-09 NOTE — ED Provider Notes (Signed)
[facility]-EMERGENCY DEPT Provider Note   CSN: [id number] Arrival date & time: 04/16/16  1909     History   Chief Complaint Chief Complaint  Patient presents with  . Loss of Consciousness    HPI Jordan Coffey is a 60 y.o. male.  Pt is a 60 y/o male with pmhx of HTN who presents to the ER via ems for near-syncope/possible syncopal episode that occurred just PTA.  Pt states he had been standing at the kitchen sick for a while preparing some food when he felt light headed and fell to his knees.  He denies LOC, but states that he attempted to get up and could not, he could not see but could hear and speak, and the episode was very brief.  His wife reports observing him fall to his knees and she reports a estimated 30 second LOC.  No head trauma.  He had a slight headache earlier in the day, but denies any other associated sx.      Near Syncope  This is a new problem. The current episode started 1 to 2 hours ago. The problem has been resolved. Associated symptoms include headaches. Pertinent negatives include no chest pain, no abdominal pain and no shortness of breath. The symptoms are aggravated by standing. Nothing relieves the symptoms.       Past Medical History:  Diagnosis Date  . Hypertension     Patient Active Problem List   Diagnosis Date Noted  . Night sweats 05/22/2016  . LVH (left ventricular hypertrophy) 05/01/2016  . Diastolic dysfunction 05/01/2016  . Shortened PR interval 04/22/2016  . BPH (benign prostatic hyperplasia) 04/21/2016  . HTN (hypertension) 04/21/2016  . Traumatic plantar fasciitis 01/19/2016  . Pain in left foot 01/19/2016    Past Surgical History:  Procedure Laterality Date  . Arm surgery    . arthroscopic knee surgery         Home Medications    Prior to Admission medications   Medication Sig Start Date End Date Taking? Authorizing Provider  ibuprofen (ADVIL,MOTRIN) 200 MG tablet Take 200 mg by mouth every 6 (six) hours as needed.   Yes  Historical Provider, MD  lisinopril (PRINIVIL,ZESTRIL) 20 MG tablet Take 1 tablet (20 mg total) by mouth daily. 05/22/16   Rodolph Bong, MD  lisinopril-hydrochlorothiazide (PRINZIDE,ZESTORETIC) 20-25 MG tablet Take 1 tablet by mouth daily. 06/19/16   Rodolph Bong, MD  Multiple Vitamins-Minerals (MULTIVITAMIN ADULT PO) Take by mouth.    Historical Provider, MD  tamsulosin (FLOMAX) 0.4 MG CAPS capsule Take 1 capsule (0.4 mg total) by mouth daily. 05/22/16   Rodolph Bong, MD    Family History Family History  Problem Relation Age of Onset  . Diabetes Mother   . Hypertension Mother   . Hypertension Father     Social History Social History  Substance Use Topics  . Smoking status: Current Some Day Smoker    Types: Cigars  . Smokeless tobacco: Current User  . Alcohol use Yes     Comment: Occasional     Allergies   Patient has no known allergies.   Review of Systems Review of Systems  Respiratory: Negative for shortness of breath.   Cardiovascular: Positive for near-syncope. Negative for chest pain.  Gastrointestinal: Negative for abdominal pain.  Neurological: Positive for headaches.  All other systems reviewed and are negative.    Physical Exam Updated Vital Signs BP 148/95   Pulse 83   Temp 97.9 F (36.6 C) (Oral)   Resp  16   SpO2 100%   Physical Exam  Constitutional: He is oriented to person, place, and time. He appears well-developed and well-nourished. No distress.  HENT:  Head: Normocephalic and atraumatic.  Right Ear: External ear normal.  Left Ear: External ear normal.  Nose: Nose normal.  Mouth/Throat: Oropharynx is clear and moist. No oropharyngeal exudate.  Eyes: Conjunctivae and EOM are normal. Pupils are equal, round, and reactive to light. Right eye exhibits no discharge. Left eye exhibits no discharge. No scleral icterus.  Neck: Normal range of motion. Neck supple. No JVD present.  Cardiovascular: Normal rate, regular rhythm and intact distal pulses.   Exam reveals no gallop and no friction rub.   Murmur heard. Pulmonary/Chest: Effort normal and breath sounds normal. No stridor. No respiratory distress. He has no wheezes. He has no rales. He exhibits no tenderness.  Abdominal: Soft. Bowel sounds are normal. He exhibits no distension. There is no tenderness. There is no guarding.  Musculoskeletal: Normal range of motion. He exhibits no edema.  Neurological: He is alert and oriented to person, place, and time. No cranial nerve deficit or sensory deficit. He exhibits normal muscle tone. Coordination normal.  Speech is clear and goal oriented, follows commands Major Cranial nerves without deficit, no facial droop Normal strength in upper and lower extremities bilaterally including dorsiflexion and plantar flexion, strong and equal grip strength Sensation normal to light and sharp touch Moves extremities without ataxia, coordination intact Normal finger to nose and rapid alternating movements Neg romberg, no pronator drift Normal gait and balance   Skin: Skin is warm and dry. No rash noted. He is not diaphoretic. No erythema. No pallor.  Psychiatric: He has a normal mood and affect. His behavior is normal. Judgment and thought content normal.  Nursing note and vitals reviewed.    ED Treatments / Results  Labs (all labs ordered are listed, but only abnormal results are displayed)  Results for orders placed or performed during the hospital encounter of 04/16/16  Basic metabolic panel  Result Value Ref Range   Sodium 137 135 - 145 mmol/L   Potassium 3.4 (L) 3.5 - 5.1 mmol/L   Chloride 104 101 - 111 mmol/L   CO2 25 22 - 32 mmol/L   Glucose, Bld 138 (H) 65 - 99 mg/dL   BUN 9 6 - 20 mg/dL   Creatinine, Ser 4.091.10 0.61 - 1.24 mg/dL   Calcium 9.3 8.9 - 81.110.3 mg/dL   GFR calc non Af Amer >60 >60 mL/min   GFR calc Af Amer >60 >60 mL/min   Anion gap 8 5 - 15  CBC  Result Value Ref Range   WBC 10.9 (H) 4.0 - 10.5 K/uL   RBC 4.76 4.22 - 5.81  MIL/uL   Hemoglobin 15.3 13.0 - 17.0 g/dL   HCT 91.444.7 78.239.0 - 95.652.0 %   MCV 93.9 78.0 - 100.0 fL   MCH 32.1 26.0 - 34.0 pg   MCHC 34.2 30.0 - 36.0 g/dL   RDW 21.314.3 08.611.5 - 57.815.5 %   Platelets 173 150 - 400 K/uL  Urinalysis, Routine w reflex microscopic  Result Value Ref Range   Color, Urine YELLOW YELLOW   APPearance CLEAR CLEAR   Specific Gravity, Urine 1.020 1.005 - 1.030   pH 5.5 5.0 - 8.0   Glucose, UA NEGATIVE NEGATIVE mg/dL   Hgb urine dipstick NEGATIVE NEGATIVE   Bilirubin Urine NEGATIVE NEGATIVE   Ketones, ur NEGATIVE NEGATIVE mg/dL   Protein, ur NEGATIVE NEGATIVE mg/dL  Nitrite NEGATIVE NEGATIVE   Leukocytes, UA NEGATIVE NEGATIVE  Rapid urine drug screen (hospital performed)  Result Value Ref Range   Opiates NONE DETECTED NONE DETECTED   Cocaine NONE DETECTED NONE DETECTED   Benzodiazepines NONE DETECTED NONE DETECTED   Amphetamines NONE DETECTED NONE DETECTED   Tetrahydrocannabinol POSITIVE (A) NONE DETECTED   Barbiturates NONE DETECTED NONE DETECTED  CBG monitoring, ED  Result Value Ref Range   Glucose-Capillary 119 (H) 65 - 99 mg/dL   No results found.    EKG  EKG Interpretation  Date/Time:  Wednesday April 16 2016 19:18:19 EDT Ventricular Rate:  93 PR Interval:    QRS Duration: 92 QT Interval:  362 QTC Calculation: 451 R Axis:   90 Text Interpretation:  Sinus rhythm Borderline short PR interval Poor R wave progression Borderline right axis deviation Confirmed by RAY MD, Duwayne HeckANIELLE [phone]([id number]) on 04/27/2016 6:36:56 PM       Radiology No results found.  Procedures Procedures (including critical care time)  Medications Ordered in ED Medications  sodium chloride 0.9 % bolus 1,000 mL (0 mLs Intravenous Stopped 04/16/16 2218)     Initial Impression / Assessment and Plan / ED Course  I have reviewed the triage vital signs and the nursing notes.  Pertinent labs & imaging results that were available during my care of the patient were reviewed by me and  considered in my medical decision making (see chart for details).  Clinical Course    Patient without arrhythmia or tachycardia while here in the department.  Patient without history of congestive heart failure, normal hematocrit, normal ECG, no shortness of breath and systolic blood pressure greater than 90; patient is low risk. Will plan for discharge home with close cardiology follow-up.  Possibility of recurrent syncope has been discussed. I discussed reasons to avoid driving until cardiology followup and other safety preventions including use of ladders and working at heights.   Pt has remained hemodynamically stable throughout their time in the ED  BP 148/95   Pulse 83   Temp 97.9 F (36.6 C) (Oral)   Resp 16   SpO2 100%    The patient was discussed with and seen by Dr. Effie ShyWentz who agrees with the treatment plan.    Final Clinical Impressions(s) / ED Diagnoses   Final diagnoses:  Near syncope    New Prescriptions Discharge Medication List as of 04/16/2016 10:39 PM       Danelle BerryLeisa Ronia Hazelett, PA-C 07/09/16 1928    Mancel BaleElliott Wentz, MD 07/11/16 1103

## 2016-07-15 ENCOUNTER — Other Ambulatory Visit: Payer: Self-pay

## 2016-07-15 ENCOUNTER — Other Ambulatory Visit: Payer: Self-pay | Admitting: Family Medicine

## 2016-07-15 DIAGNOSIS — I10 Essential (primary) hypertension: Secondary | ICD-10-CM

## 2016-09-13 ENCOUNTER — Other Ambulatory Visit: Payer: Self-pay | Admitting: Family Medicine

## 2016-10-22 ENCOUNTER — Other Ambulatory Visit: Payer: Self-pay | Admitting: Family Medicine

## 2016-11-04 ENCOUNTER — Ambulatory Visit (INDEPENDENT_AMBULATORY_CARE_PROVIDER_SITE_OTHER): Payer: BLUE CROSS/BLUE SHIELD | Admitting: Family Medicine

## 2016-11-04 VITALS — BP 132/77 | HR 92 | Wt 185.0 lb

## 2016-11-04 DIAGNOSIS — I10 Essential (primary) hypertension: Secondary | ICD-10-CM | POA: Diagnosis not present

## 2016-11-04 DIAGNOSIS — R350 Frequency of micturition: Secondary | ICD-10-CM | POA: Diagnosis not present

## 2016-11-04 DIAGNOSIS — R61 Generalized hyperhidrosis: Secondary | ICD-10-CM | POA: Diagnosis not present

## 2016-11-04 DIAGNOSIS — N401 Enlarged prostate with lower urinary tract symptoms: Secondary | ICD-10-CM | POA: Diagnosis not present

## 2016-11-04 DIAGNOSIS — F17200 Nicotine dependence, unspecified, uncomplicated: Secondary | ICD-10-CM | POA: Diagnosis not present

## 2016-11-04 MED ORDER — LISINOPRIL-HYDROCHLOROTHIAZIDE 20-12.5 MG PO TABS: 1.0000 | ORAL_TABLET | Freq: Every day | ORAL | 1 refills | Status: DC

## 2016-11-04 NOTE — Patient Instructions (Signed)
Thank you for coming in today. Switch to lisinopril 20/12.5 daily.  Continue to measure blood pressure.  Recheck in 1-3 months.  Work on quitting those last few smokes a day.  Use nicotine gum or lozenges to replace the cigarette.  You should use the 1-2 mg size.   Exercise is acting like a blood pressure medicine.   I recommend colon cancer screening.

## 2016-11-04 NOTE — Progress Notes (Signed)
       Jordan Coffey is a 61 y.o. male who [facility] today for follow-up of hypertension and night sweats.  Hypertension: Patient is doing well. He started exercising again. He notes sometimes when he finishes exercising his blood pressure is as low as 90/70 and will occasionally experience lightheadedness. Otherwise he feels well with no chest pain palpitations shortness of breath.    patient was previously complaining of night sweats. These have resolved and he feels fine with no shortness of breath.  BPH: Patient seems to take Flomax and feels fine.  Smoking: Patient continues to smoke 1 to 2 black and mild today. He's having trouble giving these up at otherwise feels well with no shortness of breath or cough  Past Medical History:  Diagnosis Date  . Hypertension    Past Surgical History:  Procedure Laterality Date  . Arm surgery    . arthroscopic knee surgery     Social History  Substance Use Topics  . Smoking status: Current Some Day Smoker    Types: Cigars  . Smokeless tobacco: Current User  . Alcohol use Yes     Comment: Occasional   family history includes Diabetes in his mother; Hypertension in his father and mother.  ROS as above:  Medications: Current Outpatient Prescriptions  Medication Sig Dispense Refill  . Multiple Vitamins-Minerals (MULTIVITAMIN ADULT PO) Take by mouth.    . tamsulosin (FLOMAX) 0.4 MG CAPS capsule Take 1 capsule (0.4 mg total) by mouth daily. 90 capsule 3  . ibuprofen (ADVIL,MOTRIN) 200 MG tablet Take 200 mg by mouth every 6 (six) hours as needed.    Marland Kitchen. lisinopril-hydrochlorothiazide (ZESTORETIC) 20-12.5 MG tablet Take 1 tablet by mouth daily. 90 tablet 1   No current facility-administered medications for this visit.    No Known Allergies  Health Maintenance Health Maintenance  Topic Date Due  . Hepatitis C  Screening  [birth year]  . COLONOSCOPY  04/11/2017 (Originally 03/15/2008)  . TETANUS/TDAP  04/21/2026  . INFLUENZA VACCINE  Completed  . HIV Screening  Completed     Exam:  BP 132/77   Pulse 92   Wt 185 lb (83.9 kg)   BMI 23.12 kg/m  Gen: Well NAD HEENT: EOMI,  MMM Lungs: Normal work of breathing. CTABL Heart: RRR no MRG Abd: NABS, Soft. Nondistended, Nontender Exts: Brisk capillary refill, warm and well perfused.    No results found for this or any previous visit (from the past 72 hour(s)). No results found.    Assessment and Plan: 61 y.o. male with  Hypertension: Some hypotensive episodes. Decrease blood pressure regimen to lisinopril 20/hydrochlorothiazide 12.5. Recheck in 1-3 months.  Night sweats resolved. Watchful waiting.  Smoking: Work on nicotine replacement gum or lozenges.  BPH: Doing well continue current regimen.  Colon cancer screening declined for now.    No orders of the defined types were placed in this encounter.  Meds ordered this encounter  Medications  . lisinopril-hydrochlorothiazide (ZESTORETIC) 20-12.5 MG tablet    Sig: Take 1 tablet by mouth daily.    Dispense:  90 tablet    Refill:  1     Discussed warning signs or symptoms. Please see discharge instructions. Patient expresses understanding.

## 2017-04-29 ENCOUNTER — Other Ambulatory Visit: Payer: Self-pay | Admitting: Family Medicine

## 2017-05-26 ENCOUNTER — Other Ambulatory Visit: Payer: Self-pay | Admitting: Family Medicine

## 2017-05-26 MED ORDER — LISINOPRIL-HYDROCHLOROTHIAZIDE 20-12.5 MG PO TABS: 1.0000 | ORAL_TABLET | Freq: Every day | ORAL | 0 refills | Status: DC

## 2017-05-29 ENCOUNTER — Other Ambulatory Visit: Payer: Self-pay | Admitting: Family Medicine

## 2017-06-20 ENCOUNTER — Other Ambulatory Visit: Payer: Self-pay | Admitting: Family Medicine

## 2017-06-24 ENCOUNTER — Ambulatory Visit: Payer: BLUE CROSS/BLUE SHIELD | Admitting: Family Medicine

## 2017-06-24 ENCOUNTER — Encounter: Payer: Self-pay | Admitting: Family Medicine

## 2017-06-24 VITALS — BP 116/71 | HR 93 | Temp 98.8°F | Ht 75.0 in | Wt 189.1 lb

## 2017-06-24 DIAGNOSIS — I10 Essential (primary) hypertension: Secondary | ICD-10-CM | POA: Diagnosis not present

## 2017-06-24 DIAGNOSIS — Z1159 Encounter for screening for other viral diseases: Secondary | ICD-10-CM

## 2017-06-24 DIAGNOSIS — R350 Frequency of micturition: Secondary | ICD-10-CM

## 2017-06-24 DIAGNOSIS — I517 Cardiomegaly: Secondary | ICD-10-CM

## 2017-06-24 DIAGNOSIS — Z23 Encounter for immunization: Secondary | ICD-10-CM

## 2017-06-24 DIAGNOSIS — N401 Enlarged prostate with lower urinary tract symptoms: Secondary | ICD-10-CM | POA: Diagnosis not present

## 2017-06-24 DIAGNOSIS — Z1211 Encounter for screening for malignant neoplasm of colon: Secondary | ICD-10-CM

## 2017-06-24 MED ORDER — TAMSULOSIN HCL 0.4 MG PO CAPS: 0.4000 mg | ORAL_CAPSULE | Freq: Every day | ORAL | 3 refills | Status: DC

## 2017-06-24 MED ORDER — LISINOPRIL-HYDROCHLOROTHIAZIDE 20-12.5 MG PO TABS: 1.0000 | ORAL_TABLET | Freq: Every day | ORAL | 3 refills | Status: DC

## 2017-06-24 NOTE — Progress Notes (Signed)
Jordan Coffey is a 61 y.o. male who [facility] today for hypertension BPH and back pain.  Hypertension: He takes hydrochlorothiazide and lisinopril daily.  He tolerates medication well with no chest pain palpitations shortness of breath lightheadedness or dizziness.  BPH: He takes Flomax daily for BPH.  He notes this has improved his urinary symptoms and is satisfied with how things are going.  Back pain: He has a history of chronic mild back pain.  He notes occasional pain radiating to his right leg notes his pain is mostly in his back.  Symptoms are mild to moderate and pretty well controlled with over-the-counter Tylenol or ibuprofen.  He denies any weakness or numbness or loss of function.  He denies bowel bladder dysfunction fevers or chills.   Past Medical History:  Diagnosis Date  . Hypertension    Past Surgical History:  Procedure Laterality Date  . Arm surgery    . arthroscopic knee surgery     Social History   Tobacco Use  . Smoking status: Current Some Day Smoker    Types: Cigars  . Smokeless tobacco: Current User  Substance Use Topics  . Alcohol use: Yes    Comment: Occasional   family history includes Diabetes in his mother; Hypertension in his father and mother.  ROS as above:  Medications: Current Outpatient Medications  Medication Sig Dispense Refill  . ibuprofen (ADVIL,MOTRIN) 200 MG tablet Take 200 mg by mouth every 6 (six) hours as needed.    Marland Kitchen. lisinopril-hydrochlorothiazide (PRINZIDE,ZESTORETIC) 20-12.5 MG tablet Take 1 tablet daily by mouth. 90 tablet 3  . Multiple Vitamins-Minerals (MULTIVITAMIN ADULT PO) Take by mouth.    . tamsulosin (FLOMAX) 0.4 MG CAPS capsule Take 1 capsule (0.4 mg total) daily by mouth. 90 capsule 3   No current facility-administered medications for this visit.    No Known Allergies  Health  Maintenance Health Maintenance  Topic Date Due  . Hepatitis C Screening  [birth year]  . COLONOSCOPY  03/15/2008  . INFLUENZA VACCINE  03/11/2017  . TETANUS/TDAP  04/21/2026  . HIV Screening  Completed     Exam:  BP 116/71   Pulse 93   Temp 98.8 F (37.1 C) (Oral)   Ht 6\' 3"  (1.905 m)   Wt 189 lb 1.9 oz (85.8 kg)   BMI 23.64 kg/m  Gen: Well NAD HEENT: EOMI,  MMM Lungs: Normal work of breathing. CTABL Heart: RRR no MRG Abd: NABS, Soft. Nondistended, Nontender Exts: Brisk capillary refill, warm and well perfused.  L spine: Nontender to spinal midline.  Mildly tender to palpation bilateral lumbar paraspinal muscles. Normal lumbar motion. Lower externally strength reflexes and sensation are equal and normal throughout. Normal gait.   No results found for this or any previous visit (from the past 72 hour(s)). No results found.    Assessment and Plan: 61 y.o. male with  Hypertension: Blood pressure is well controlled with current regimen.  Plan to check basic fasting labs listed below and continue current regimen.  Recheck yearly.  BPH: Doing quite well with Flomax.  Plan to continue current regimen and check PSA.  Chronic back pain: Doing pretty well.  We had a discussion about prognosis of chronic back pain.  He is doing this pretty well and is pretty stable.  I do not think further x-rays at this time are needed.  We will continue current regimen and continue watchful waiting.  Health maintenance: Influenza  vaccine given today. Patient referred to gastroenterology for colonoscopy. Hepatitis C obtained as part of screening for hepatitis C for routine health maintenance.  Orders Placed This Encounter  Procedures  . Flu Vaccine QUAD 36+ mos IM (Fluarix & Fluzone Quad PF  . CBC  . COMPLETE METABOLIC PANEL WITH GFR  . Lipid Panel w/reflex Direct LDL  . PSA  . Hepatitis C antibody  . Ambulatory referral to Gastroenterology    Referral Priority:   Routine    Referral  Type:   Consultation    Referral Reason:   Specialty Services Required    Number of Visits Requested:   1   Meds ordered this encounter  Medications  . tamsulosin (FLOMAX) 0.4 MG CAPS capsule    Sig: Take 1 capsule (0.4 mg total) daily by mouth.    Dispense:  90 capsule    Refill:  3  . lisinopril-hydrochlorothiazide (PRINZIDE,ZESTORETIC) 20-12.5 MG tablet    Sig: Take 1 tablet daily by mouth.    Dispense:  90 tablet    Refill:  3     Discussed warning signs or symptoms. Please see discharge instructions. Patient expresses understanding.

## 2017-06-24 NOTE — Patient Instructions (Signed)
Thank you for coming in today. Get labs today.  Continue medicine.  You should hear from [facility] soon about colon cancer screening.  Recheck with me in 1 year or sooner if needed.

## 2017-06-25 ENCOUNTER — Other Ambulatory Visit: Payer: Self-pay | Admitting: Family Medicine

## 2017-06-25 LAB — COMPLETE METABOLIC PANEL WITH GFR
AG Ratio: 1.3 (calc) (ref 1.0–2.5)
ALBUMIN MSPROF: 4.2 g/dL (ref 3.6–5.1)
ALKALINE PHOSPHATASE (APISO): 70 U/L (ref 40–115)
ALT: 16 U/L (ref 9–46)
AST: 16 U/L (ref 10–35)
BILIRUBIN TOTAL: 0.3 mg/dL (ref 0.2–1.2)
BUN: 13 mg/dL (ref 7–25)
CHLORIDE: 104 mmol/L (ref 98–110)
CO2: 29 mmol/L (ref 20–32)
CREATININE: 1.09 mg/dL (ref 0.70–1.33)
Calcium: 9.3 mg/dL (ref 8.6–10.3)
GFR, Est African American: 86 mL/min/{1.73_m2} (ref >=60)
GFR, Est Non African American: 74 mL/min/{1.73_m2} (ref >=60)
GLUCOSE: 100 mg/dL — AB (ref 65–99)
Globulin: 3.3 g/dL (calc) (ref 1.9–3.7)
Potassium: 4.1 mmol/L (ref 3.5–5.3)
Sodium: 140 mmol/L (ref 135–146)
TOTAL PROTEIN: 7.5 g/dL (ref 6.1–8.1)

## 2017-06-25 LAB — CBC
HCT: 41 % (ref 38.5–50.0)
HEMOGLOBIN: 14.3 g/dL (ref 13.2–17.1)
MCH: 30.9 pg (ref 27.0–33.0)
MCHC: 34.9 g/dL (ref 32.0–36.0)
MCV: 88.6 fL (ref 80.0–100.0)
MPV: 10.7 fL (ref 7.5–12.5)
PLATELETS: 201 10*3/uL (ref 140–400)
RBC: 4.63 10*6/uL (ref 4.20–5.80)
RDW: 13.4 % (ref 11.0–15.0)
WBC: 8.4 10*3/uL (ref 3.8–10.8)

## 2017-06-25 LAB — PSA: PSA: 1.1 ng/mL (ref <=4.0)

## 2017-06-25 LAB — LIPID PANEL W/REFLEX DIRECT LDL
CHOL/HDL RATIO: 4.3 (calc) (ref <5.0)
CHOLESTEROL: 221 mg/dL — AB (ref <200)
HDL: 51 mg/dL (ref >40)
LDL CHOLESTEROL (CALC): 139 mg/dL — AB
Non-HDL Cholesterol (Calc): 170 mg/dL (calc) — ABNORMAL HIGH (ref <130)
TRIGLYCERIDES: 175 mg/dL — AB (ref <150)

## 2017-06-25 LAB — HEPATITIS C ANTIBODY
HEP C AB: NONREACTIVE
SIGNAL TO CUT-OFF: 0.41 (ref <1.00)

## 2017-06-25 MED ORDER — ATORVASTATIN CALCIUM 40 MG PO TABS: 40.0000 mg | ORAL_TABLET | Freq: Every day | ORAL | 0 refills | Status: DC

## 2017-07-24 ENCOUNTER — Encounter: Payer: Self-pay | Admitting: Family Medicine

## 2017-09-20 ENCOUNTER — Other Ambulatory Visit: Payer: Self-pay | Admitting: Family Medicine

## 2017-12-16 ENCOUNTER — Other Ambulatory Visit: Payer: Self-pay | Admitting: Family Medicine

## 2018-03-19 ENCOUNTER — Other Ambulatory Visit: Payer: Self-pay | Admitting: Family Medicine

## 2018-03-19 MED ORDER — ATORVASTATIN CALCIUM 40 MG PO TABS: 40.0000 mg | ORAL_TABLET | Freq: Every day | ORAL | 0 refills | Status: DC

## 2018-06-15 ENCOUNTER — Encounter: Payer: Self-pay | Admitting: Family Medicine

## 2018-06-15 ENCOUNTER — Ambulatory Visit (INDEPENDENT_AMBULATORY_CARE_PROVIDER_SITE_OTHER): Payer: BLUE CROSS/BLUE SHIELD | Admitting: Family Medicine

## 2018-06-15 VITALS — BP 100/66 | HR 97 | Temp 98.2°F | Ht 74.0 in | Wt 189.0 lb

## 2018-06-15 DIAGNOSIS — E785 Hyperlipidemia, unspecified: Secondary | ICD-10-CM

## 2018-06-15 DIAGNOSIS — I10 Essential (primary) hypertension: Secondary | ICD-10-CM

## 2018-06-15 DIAGNOSIS — N401 Enlarged prostate with lower urinary tract symptoms: Secondary | ICD-10-CM

## 2018-06-15 DIAGNOSIS — Z23 Encounter for immunization: Secondary | ICD-10-CM

## 2018-06-15 DIAGNOSIS — E782 Mixed hyperlipidemia: Secondary | ICD-10-CM

## 2018-06-15 DIAGNOSIS — R350 Frequency of micturition: Secondary | ICD-10-CM

## 2018-06-15 HISTORY — DX: Hyperlipidemia, unspecified: E78.5

## 2018-06-15 LAB — POCT URINALYSIS DIPSTICK
BILIRUBIN UA: NEGATIVE
Glucose, UA: NEGATIVE
KETONES UA: NEGATIVE
NITRITE UA: POSITIVE
SPEC GRAV UA: 1.025 (ref 1.010–1.025)
UROBILINOGEN UA: 2 U/dL — AB
pH, UA: 6 (ref 5.0–8.0)

## 2018-06-15 MED ORDER — CIPROFLOXACIN HCL 500 MG PO TABS: 500.0000 mg | ORAL_TABLET | Freq: Two times a day (BID) | ORAL | 0 refills | Status: DC

## 2018-06-15 MED ORDER — ATORVASTATIN CALCIUM 40 MG PO TABS: 40.0000 mg | ORAL_TABLET | Freq: Every day | ORAL | 0 refills | Status: DC

## 2018-06-15 NOTE — Patient Instructions (Addendum)
Thank you for coming in today. Take cipro twice daily for 2 weeks.  Recheck in 1 month. Will get labs then.  Let me know if not doing well.  Restart atorvistatin for cheoleterol.     Prostatitis Prostatitis is swelling or inflammation of the prostate gland. The prostate is a walnut-sized gland that is involved in the production of semen. It is located below a man's bladder, in front of the rectum. There are four types of prostatitis:  Chronic nonbacterial prostatitis. This is the most common type of prostatitis. It may be associated with a viral infection or autoimmune disorder.  Acute bacterial prostatitis. This is the least common type of prostatitis. It starts quickly and is usually associated with a bladder infection, high fever, and shaking chills. It can occur at any age.  Chronic bacterial prostatitis. This type usually results from acute bacterial prostatitis that happens repeatedly (is recurrent) or has not been treated properly. It can occur in men of any age but is most common among middle-aged men whose prostate has begun to get larger. The symptoms are not as severe as symptoms caused by acute bacterial prostatitis.  Prostatodynia or chronic pelvic pain syndrome (CPPS). This type is also called pelvic floor disorder. It is associated with increased muscular tone in the pelvis surrounding the prostate.  What are the causes? Bacterial prostatitis is caused by infection from bacteria. Chronic nonbacterial prostatitis may be caused by:  Urinary tract infections (UTIs).  Nerve damage.  A response by the body's disease-fighting system (autoimmune response).  Chemicals in the urine.  The causes of the other types of prostatitis are usually not known. What are the signs or symptoms? Symptoms of this condition vary depending upon the type of prostatitis. If you have acute bacterial prostatitis, you may experience:  Urinary symptoms, such as: ? Painful urination. ? Burning  during urination. ? Frequent and sudden urges to urinate. ? Inability to start urinating. ? A weak or interrupted stream of urine.  Vomiting.  Nausea.  Fever.  Chills.  Inability to empty the bladder completely.  Pain in the: ? Muscles or joints. ? Lower back. ? Lower abdomen.  If you have any of the other types of prostatitis, you may experience:  Urinary symptoms, such as: ? Sudden urges to urinate. ? Frequent urination. ? Difficulty starting urination. ? Weak urine stream. ? Dribbling after urination.  Discharge from the urethra. The urethra is a tube that opens at the end of the penis.  Pain in the: ? Testicles. ? Penis or tip of the penis. ? Rectum. ? Area in front of the rectum and below the scrotum (perineum).  Problems with sexual function.  Painful ejaculation.  Bloody semen.  How is this diagnosed? This condition may be diagnosed based on:  A physical and medical exam.  Your symptoms.  A urine test to check for bacteria.  An exam in which a health care provider uses a finger to feel the prostate (digital rectal exam).  A test of a sample of semen.  Blood tests.  Ultrasound.  Removal of prostate tissue to be examined under a microscope (biopsy).  Tests to check how your body handles urine (urodynamic tests).  A test to look inside your bladder or urethra (cystoscopy).  How is this treated? Treatment for this condition depends on the type of prostatitis. Treatment may involve:  Medicines to relieve pain or inflammation.  Medicines to help relax your muscles.  Physical therapy.  Heat therapy.  Techniques to  help you control certain body functions (biofeedback).  Relaxation exercises.  Antibiotic medicine, if your condition is caused by bacteria.  Warm water baths (sitz baths). Sitz baths help with relaxing your pelvic floor muscles, which helps to relieve pressure on the prostate.  Follow these instructions at home:  Take  over-the-counter and prescription medicines only as told by your health care provider.  If you were prescribed an antibiotic, take it as told by your health care provider. Do not stop taking the antibiotic even if you start to feel better.  If physical therapy, biofeedback, or relaxation exercises were prescribed, do exercises as instructed.  Take sitz baths as directed by your health care provider. For a sitz bath, sit in warm water that is deep enough to cover your hips and buttocks.  Keep all follow-up visits as told by your health care provider. This is important. Contact a health care provider if:  Your symptoms get worse.  You have a fever. Get help right away if:  You have chills.  You feel nauseous.  You vomit.  You feel light-headed or feel like you are going to faint.  You are unable to urinate.  You have blood or blood clots in your urine. This information is not intended to replace advice given to you by your health care provider. Make sure you discuss any questions you have with your health care provider. Document Released: 07/25/2000 Document Revised: 04/17/2016 Document Reviewed: 04/17/2016 Elsevier Interactive Patient Education  2017 [employer].

## 2018-06-15 NOTE — Progress Notes (Signed)
Jordan Coffey is a 62 y.o. male who presents to [facility]: Primary Care Sports Medicine today for urinary frequency urgency and straining to urinate worsening over the past month worsening over the past week.  Patient also notes blood in the urine starting a few days ago.  He notes some pelvic pressure.  No fevers chills nausea vomiting or diarrhea.  No treatment tried yet.  He is a previous diagnosis of BPH and previously was doing well on Flomax.  He stopped the Flomax on his own over the summer and recently restarted it.  Hypertension doing well with lisinopril/hydrochlorothiazide.  No chest pain palpitations shortness of breath.  Hyperlipidemia:Reynol ran out of atorvastatin a few months ago and has not restarted it.  No muscle aches or pains.  He tolerated it well.   ROS as above:  Exam:  BP 100/66   Pulse 97   Temp 98.2 F (36.8 C) (Oral)   Ht 6\' 2"  (1.88 m)   Wt 189 lb (85.7 kg)   BMI 24.27 kg/m  Wt Readings from Last 5 Encounters:  06/15/18 189 lb (85.7 kg)  06/24/17 189 lb 1.9 oz (85.8 kg)  11/04/16 185 lb (83.9 kg)  06/19/16 167 lb (75.8 kg)  05/22/16 164 lb (74.4 kg)    Gen: Well NAD HEENT: EOMI,  MMM Lungs: Normal work of breathing. CTABL Heart: RRR no MRG Abd: NABS, Soft. Nondistended, Nontender Exts: Brisk capillary refill, warm and well perfused.  Prostate exam: Prostate is enlarged boggy with no nodules mildly tender.  Lab and Radiology Results Results for orders placed or performed in visit on 06/15/18 (from the past 72 hour(s))  POCT Urinalysis Dipstick     Status: Abnormal   Collection Time: 06/15/18 10:14 AM  Result Value Ref Range   Color, UA amber    Clarity, UA cloudy    Glucose, UA Negative Negative   Bilirubin, UA n    Ketones, UA n    Spec Grav, UA 1.025 1.010 - 1.025   Blood, UA Moderate    pH, UA 6.0 5.0 - 8.0   Protein, UA     Urobilinogen, UA  2.0 (A) 0.2 or 1.0 E.U./dL   Nitrite, UA positive    Leukocytes, UA Large (3+) (A) Negative   Appearance     Odor     No results found.    Assessment and Plan: 62 y.o. male with  Prostatitis likely supination symptoms given history of BPH.  Plan for empiric treatment with Cipro.  Urine culture pending.  Recheck 1 month at that time we will check PSA.  Hypertension doing well continue current regimen.  Hyperlipidemia: Restart atorvastatin recheck 1 month likely will recheck check fasting lipids at that time.   F/u vaccine given   Orders Placed This Encounter  Procedures  . Urine Culture  . Flu Vaccine QUAD 36+ mos IM  . POCT Urinalysis Dipstick   No orders of the defined types were placed in this encounter.    Historical information moved to improve visibility of documentation.  Past Medical History:  Diagnosis Date  . Hypertension    Past Surgical History:  Procedure Laterality Date  . Arm surgery    . arthroscopic knee surgery     Social History   Tobacco Use  . Smoking status: Current Some Day Smoker    Types: Cigars  . Smokeless tobacco: Current User  Substance Use Topics  . Alcohol use: Yes  Comment: Occasional   family history includes Diabetes in his mother; Hypertension in his father and mother.  Medications: Current Outpatient Medications  Medication Sig Dispense Refill  . ibuprofen (ADVIL,MOTRIN) 200 MG tablet Take 200 mg by mouth every 6 (six) hours as needed.    Marland Kitchen lisinopril-hydrochlorothiazide (PRINZIDE,ZESTORETIC) 20-12.5 MG tablet Take 1 tablet daily by mouth. 90 tablet 3  . Multiple Vitamins-Minerals (MULTIVITAMIN ADULT PO) Take by mouth.    . tamsulosin (FLOMAX) 0.4 MG CAPS capsule Take 1 capsule (0.4 mg total) daily by mouth. 90 capsule 3  . atorvastatin (LIPITOR) 40 MG tablet Take 1 tablet (40 mg total) by mouth daily. Due for follow up appointment. (Patient not taking: Reported on 06/15/2018) 30 tablet 0   No current  facility-administered medications for this visit.    No Known Allergies   Discussed warning signs or symptoms. Please see discharge instructions. Patient expresses understanding.

## 2018-06-17 ENCOUNTER — Other Ambulatory Visit: Payer: Self-pay | Admitting: Family Medicine

## 2018-06-17 DIAGNOSIS — N401 Enlarged prostate with lower urinary tract symptoms: Secondary | ICD-10-CM

## 2018-06-17 DIAGNOSIS — R350 Frequency of micturition: Secondary | ICD-10-CM

## 2018-06-17 LAB — URINE CULTURE
MICRO NUMBER: 91330933
SPECIMEN QUALITY: ADEQUATE

## 2018-07-15 ENCOUNTER — Encounter: Payer: Self-pay | Admitting: Family Medicine

## 2018-07-15 ENCOUNTER — Ambulatory Visit (INDEPENDENT_AMBULATORY_CARE_PROVIDER_SITE_OTHER): Payer: BLUE CROSS/BLUE SHIELD | Admitting: Family Medicine

## 2018-07-15 VITALS — BP 117/69 | HR 94 | Ht 74.0 in | Wt 191.0 lb

## 2018-07-15 DIAGNOSIS — Z1211 Encounter for screening for malignant neoplasm of colon: Secondary | ICD-10-CM

## 2018-07-15 DIAGNOSIS — N401 Enlarged prostate with lower urinary tract symptoms: Secondary | ICD-10-CM

## 2018-07-15 DIAGNOSIS — F17200 Nicotine dependence, unspecified, uncomplicated: Secondary | ICD-10-CM

## 2018-07-15 DIAGNOSIS — E782 Mixed hyperlipidemia: Secondary | ICD-10-CM

## 2018-07-15 DIAGNOSIS — Z0000 Encounter for general adult medical examination without abnormal findings: Secondary | ICD-10-CM

## 2018-07-15 DIAGNOSIS — I10 Essential (primary) hypertension: Secondary | ICD-10-CM

## 2018-07-15 DIAGNOSIS — R350 Frequency of micturition: Secondary | ICD-10-CM

## 2018-07-15 DIAGNOSIS — Z6824 Body mass index (BMI) 24.0-24.9, adult: Secondary | ICD-10-CM

## 2018-07-15 LAB — LIPID PANEL W/REFLEX DIRECT LDL
Cholesterol: 139 mg/dL (ref <200)
HDL: 43 mg/dL (ref >40)
LDL Cholesterol (Calc): 77 mg/dL (calc)
Non-HDL Cholesterol (Calc): 96 mg/dL (calc) (ref <130)
TRIGLYCERIDES: 111 mg/dL (ref <150)
Total CHOL/HDL Ratio: 3.2 (calc) (ref <5.0)

## 2018-07-15 LAB — COMPLETE METABOLIC PANEL WITH GFR
AG RATIO: 1.2 (calc) (ref 1.0–2.5)
ALT: 39 U/L (ref 9–46)
AST: 30 U/L (ref 10–35)
Albumin: 3.8 g/dL (ref 3.6–5.1)
Alkaline phosphatase (APISO): 74 U/L (ref 40–115)
BILIRUBIN TOTAL: 0.4 mg/dL (ref 0.2–1.2)
BUN: 12 mg/dL (ref 7–25)
CHLORIDE: 105 mmol/L (ref 98–110)
CO2: 28 mmol/L (ref 20–32)
Calcium: 9.3 mg/dL (ref 8.6–10.3)
Creat: 1.17 mg/dL (ref 0.70–1.25)
GFR, EST AFRICAN AMERICAN: 78 mL/min/{1.73_m2} (ref >=60)
GFR, Est Non African American: 67 mL/min/{1.73_m2} (ref >=60)
Globulin: 3.1 g/dL (calc) (ref 1.9–3.7)
Glucose, Bld: 87 mg/dL (ref 65–99)
POTASSIUM: 4.1 mmol/L (ref 3.5–5.3)
Sodium: 138 mmol/L (ref 135–146)
TOTAL PROTEIN: 6.9 g/dL (ref 6.1–8.1)

## 2018-07-15 LAB — CBC
HCT: 39.4 % (ref 38.5–50.0)
Hemoglobin: 13.3 g/dL (ref 13.2–17.1)
MCH: 30.1 pg (ref 27.0–33.0)
MCHC: 33.8 g/dL (ref 32.0–36.0)
MCV: 89.1 fL (ref 80.0–100.0)
MPV: 10.7 fL (ref 7.5–12.5)
PLATELETS: 196 10*3/uL (ref 140–400)
RBC: 4.42 10*6/uL (ref 4.20–5.80)
RDW: 14 % (ref 11.0–15.0)
WBC: 10.8 10*3/uL (ref 3.8–10.8)

## 2018-07-15 LAB — PSA: PSA: 3.1 ng/mL (ref <=4.0)

## 2018-07-15 NOTE — Progress Notes (Signed)
Jordan Coffey is a 62 y.o. male who presents to [facility]: Primary Care Sports Medicine today for well adult visit.   Jordan Coffey is doing well overall.  He takes medications listed below and is pretty happy with how things are going.  He exercises regularly and tries to eat a careful diet.  He does smoke a few black and mild cigarettes per day but is continuing to cut back.  He was seen last month for acute prostatitis and feels much better now on Flomax.  He completed a course of ciprofloxacin.  Additionally his blood pressure and hyperlipidemia is well controlled with medications listed below.  He denies any significant depressive symptoms and is happy with how things are going in his life.   ROS as above:  Past Medical History:  Diagnosis Date  . HLD (hyperlipidemia) 06/15/2018  . Hypertension    Past Surgical History:  Procedure Laterality Date  . Arm surgery    . arthroscopic knee surgery     Social History   Tobacco Use  . Smoking status: Current Some Day Smoker    Types: Cigars  . Smokeless tobacco: Current User  Substance Use Topics  . Alcohol use: Yes    Comment: Occasional   family history includes Diabetes in his mother; Hypertension in his father and mother.  Medications: Current Outpatient Medications  Medication Sig Dispense Refill  . atorvastatin (LIPITOR) 40 MG tablet Take 1 tablet (40 mg total) by mouth daily. Due for follow up appointment. 90 tablet 0  . ibuprofen (ADVIL,MOTRIN) 200 MG tablet Take 200 mg by mouth every 6 (six) hours as needed.    Marland Kitchen lisinopril-hydrochlorothiazide (PRINZIDE,ZESTORETIC) 20-12.5 MG tablet TAKE 1 TABLET BY MOUTH EVERY DAY 90 tablet 3  . Multiple Vitamins-Minerals (MULTIVITAMIN ADULT PO) Take by mouth.    . tamsulosin (FLOMAX) 0.4 MG CAPS capsule TAKE 1 CAPSULE (0.4 MG TOTAL) DAILY BY MOUTH. 90 capsule 3   No current facility-administered  medications for this visit.    No Known Allergies  Health Maintenance Health Maintenance  Topic Date Due  . Fecal DNA (Cologuard)  03/15/2008  . TETANUS/TDAP  04/21/2026  . INFLUENZA VACCINE  Completed  . Hepatitis C Screening  Completed  . HIV Screening  Completed     Exam:  BP 117/69   Pulse 94   Ht 6\' 2"  (1.88 m)   Wt 191 lb (86.6 kg)   BMI 24.52 kg/m  Wt Readings from Last 5 Encounters:  07/15/18 191 lb (86.6 kg)  06/15/18 189 lb (85.7 kg)  06/24/17 189 lb 1.9 oz (85.8 kg)  11/04/16 185 lb (83.9 kg)  06/19/16 167 lb (75.8 kg)      Gen: Well NAD HEENT: EOMI,  MMM Lungs: Normal work of breathing. CTABL Heart: RRR no MRG Abd: NABS, Soft. Nondistended, Nontender Exts: Brisk capillary refill, warm and well perfused.  Psych: Alert and oriented normal speech thought process and affect.  Depression screen [facility] 2/9 07/15/2018 06/24/2017 01/19/2016  Decreased Interest 0 0 0  Down, Depressed, Hopeless 0 0 0  PHQ - 2 Score 0 0 0      Assessment and Plan: 62 y.o. male with well adult visit.  Doing reasonably well.  Weight is controlled.  Patient gets regular exercise and is happy with how things are going.  Plan to check basic fasting labs including metabolic panel lipids.  Additionally will recheck PSA.  Return yearly if all is well.  Return sooner if  needed.  Colon cancer screening we will proceed with Cologuard.   Orders Placed This Encounter  Procedures  . CBC  . COMPLETE METABOLIC PANEL WITH GFR  . Lipid Panel w/reflex Direct LDL  . PSA   No orders of the defined types were placed in this encounter.    Discussed warning signs or symptoms. Please see discharge instructions. Patient expresses understanding.

## 2018-07-15 NOTE — Addendum Note (Signed)
Addended by: Pixie CasinoUNNINGHAM, Amaya Blakeman C on: 07/15/2018 01:42 PM   Modules accepted: Orders

## 2018-07-15 NOTE — Patient Instructions (Addendum)
Thank you for coming in today. Get labs today.  Recheck yearly if all is well.  Return sooner if needed.     Cologuard is covered by Medicare and most major insurers. INSURANCE Cologuard is covered by Medicare and Medicare Advantage with no co-pay or deductible for eligible patients.  More than 92%* of all Cologuard patients have no out of-pocket cost for screening.  Based on the Affordable Care Act, Cologuard should be covered by most private insurers with no co-pay or deductible for eligible patients (ages 7450-75; at average risk for colon cancer; without symptoms). Some exceptions may apply, so we recommend patients call their insurer to confirm.  [place] Call Cologuard at [phone] and speak with a member of our Patient Support Team to make sure the test will be covered.

## 2018-08-19 LAB — COLOGUARD: COLOGUARD: NEGATIVE

## 2018-08-23 ENCOUNTER — Telehealth: Payer: Self-pay | Admitting: Family Medicine

## 2018-08-23 NOTE — Telephone Encounter (Signed)
Received Cologuard report.  Cologuard was negative.  You will be due for rescreening in 3 years. Report will be sent to abstract.

## 2018-09-06 ENCOUNTER — Other Ambulatory Visit: Payer: Self-pay | Admitting: Family Medicine

## 2018-09-10 ENCOUNTER — Ambulatory Visit (INDEPENDENT_AMBULATORY_CARE_PROVIDER_SITE_OTHER): Payer: BLUE CROSS/BLUE SHIELD | Admitting: Physician Assistant

## 2018-09-10 ENCOUNTER — Encounter: Payer: Self-pay | Admitting: Physician Assistant

## 2018-09-10 VITALS — BP 135/85 | HR 106 | Temp 100.3°F | Ht 74.0 in | Wt 193.0 lb

## 2018-09-10 DIAGNOSIS — R059 Cough, unspecified: Secondary | ICD-10-CM

## 2018-09-10 DIAGNOSIS — J101 Influenza due to other identified influenza virus with other respiratory manifestations: Secondary | ICD-10-CM

## 2018-09-10 DIAGNOSIS — R05 Cough: Secondary | ICD-10-CM

## 2018-09-10 LAB — POCT INFLUENZA A/B
Influenza A, POC: POSITIVE — AB
Influenza B, POC: NEGATIVE

## 2018-09-10 MED ORDER — HYDROCOD POLST-CPM POLST ER 10-8 MG/5ML PO SUER: 5.0000 mL | Freq: Two times a day (BID) | ORAL | 0 refills | Status: DC | PRN

## 2018-09-10 MED ORDER — OSELTAMIVIR PHOSPHATE 75 MG PO CAPS: 75.0000 mg | ORAL_CAPSULE | Freq: Two times a day (BID) | ORAL | 0 refills | Status: DC

## 2018-09-10 MED ORDER — BALOXAVIR MARBOXIL(80 MG DOSE) 2 X 40 MG PO TBPK: 2.0000 | ORAL_TABLET | Freq: Once | ORAL | 0 refills | Status: AC

## 2018-09-10 NOTE — Patient Instructions (Signed)

## 2018-09-10 NOTE — Progress Notes (Signed)
Subjective:    Patient ID: Jordan Coffey, male    DOB: 02/16/1956, 63 y.o.   MRN: [id number]  HPI Pt is a 63 yo male who presents to the clinic with fever, chills, body aches and cough. He is coughing so hard his back and under ribs hurt. He has had cough for 3 days but extreme fever, body aches, chills for 2 days. He has not checked temperature at home. Alka seltzer plus is not doing anything. He has not ever had the flu and has never felt like this. He did have a flu shot. No other sick contacts. Hx of UTI but denies any urinary symptoms today.   .. Active Ambulatory Problems    Diagnosis Date Noted  . BPH (benign prostatic hyperplasia) 04/21/2016  . HTN (hypertension) 04/21/2016  . Shortened PR interval 04/22/2016  . LVH (left ventricular hypertrophy) 05/01/2016  . Diastolic dysfunction 05/01/2016  . Smoker 11/04/2016  . HLD (hyperlipidemia) 06/15/2018   Resolved Ambulatory Problems    Diagnosis Date Noted  . Traumatic plantar fasciitis 01/19/2016  . Pain in left foot 01/19/2016  . Night sweats 05/22/2016   Past Medical History:  Diagnosis Date  . Hypertension             Review of Systems See HPI.     Objective:   Physical Exam Vitals signs reviewed.  Constitutional:      Appearance: Normal appearance. He is diaphoretic.  HENT:     Head: Normocephalic and atraumatic.     Right Ear: Tympanic membrane and ear canal normal.     Left Ear: Tympanic membrane and ear canal normal.     Nose: Congestion and rhinorrhea present.     Mouth/Throat:     Mouth: Mucous membranes are moist.     Pharynx: Posterior oropharyngeal erythema present.  Eyes:     Conjunctiva/sclera: Conjunctivae normal.     Comments: Watery eye discharge.   Cardiovascular:     Rate and Rhythm: Regular rhythm. Tachycardia present.     Pulses: Normal pulses.  Pulmonary:     Effort: Pulmonary effort is normal.     Breath sounds: Normal breath sounds. No wheezing or rhonchi.  Abdominal:   Tenderness: There is no right CVA tenderness or left CVA tenderness.  Neurological:     General: No focal deficit present.     Mental Status: He is alert and oriented to person, place, and time.  Psychiatric:        Mood and Affect: Mood normal.        Behavior: Behavior normal.           Assessment & Plan:  Marland KitchenMarland KitchenLumumba was seen today for cough and fever.  Diagnoses and all orders for this visit:  Influenza A -     Baloxavir Marboxil,80 MG Dose, (XOFLUZA) 2 x 40 MG TBPK; Take 2 tablets by mouth once for 1 dose. -     oseltamivir (TAMIFLU) 75 MG capsule; Take 1 capsule (75 mg total) by mouth 2 (two) times daily. -     POCT Influenza A/B  Cough -     chlorpheniramine-HYDROcodone (TUSSIONEX) 10-8 MG/5ML SUER; Take 5 mLs by mouth every 12 (twelve) hours as needed for cough (cough, will cause drowsiness.).   Marland Kitchen. Results for orders placed or performed in visit on 09/10/18  POCT Influenza A/B  Result Value Ref Range   Influenza A, POC Positive (A) Negative   Influenza B, POC Negative Negative   Pt is positive for  flu A. Sent xofluza with coupon. If not affordable can try tamiflu with good rx. Take one or the other.  Symptomatic care discussed.  tussionex given for cough. Sedation warning. Small quantity given. [place] controlled substance database reviewed with no concerns.

## 2018-10-15 ENCOUNTER — Telehealth: Payer: Self-pay | Admitting: Family Medicine

## 2018-10-15 DIAGNOSIS — R972 Elevated prostate specific antigen [PSA]: Secondary | ICD-10-CM

## 2018-10-15 NOTE — Telephone Encounter (Signed)
-----   Message from Rodolph Bong, MD sent at 07/16/2018  8:02 AM EST ----- Regarding: Check PSA Elevated PSA.  Recheck in 3 months.

## 2018-10-15 NOTE — Telephone Encounter (Signed)
PSA was elevated when checked in December.  Patient due for recheck now.  Lab ordered.  Lab can be done anytime does not need to be fasting.

## 2018-10-15 NOTE — Telephone Encounter (Signed)
Left VM with recommendation  

## 2018-12-01 ENCOUNTER — Other Ambulatory Visit: Payer: Self-pay | Admitting: Family Medicine

## 2018-12-22 LAB — PSA: PSA: 1.2 ng/mL (ref <=4.0)

## 2019-04-19 ENCOUNTER — Ambulatory Visit: Payer: BC Managed Care – PPO

## 2019-04-19 ENCOUNTER — Other Ambulatory Visit: Payer: Self-pay

## 2019-04-19 ENCOUNTER — Ambulatory Visit (INDEPENDENT_AMBULATORY_CARE_PROVIDER_SITE_OTHER): Payer: BC Managed Care – PPO | Admitting: Family Medicine

## 2019-04-19 VITALS — BP 112/72 | HR 99 | Temp 98.3°F | Wt 168.0 lb

## 2019-04-19 DIAGNOSIS — T792XXA Traumatic secondary and recurrent hemorrhage and seroma, initial encounter: Secondary | ICD-10-CM

## 2019-04-19 DIAGNOSIS — Z23 Encounter for immunization: Secondary | ICD-10-CM | POA: Diagnosis not present

## 2019-04-19 DIAGNOSIS — R350 Frequency of micturition: Secondary | ICD-10-CM | POA: Diagnosis not present

## 2019-04-19 DIAGNOSIS — L03116 Cellulitis of left lower limb: Secondary | ICD-10-CM

## 2019-04-19 DIAGNOSIS — M7989 Other specified soft tissue disorders: Secondary | ICD-10-CM

## 2019-04-19 DIAGNOSIS — N401 Enlarged prostate with lower urinary tract symptoms: Secondary | ICD-10-CM

## 2019-04-19 MED ORDER — ATORVASTATIN CALCIUM 40 MG PO TABS: 40.0000 mg | ORAL_TABLET | Freq: Every day | ORAL | 3 refills | Status: DC

## 2019-04-19 MED ORDER — DOXYCYCLINE HYCLATE 100 MG PO TABS: 100.0000 mg | ORAL_TABLET | Freq: Two times a day (BID) | ORAL | 0 refills | Status: DC

## 2019-04-19 MED ORDER — LISINOPRIL-HYDROCHLOROTHIAZIDE 20-12.5 MG PO TABS: 1.0000 | ORAL_TABLET | Freq: Every day | ORAL | 3 refills | Status: DC

## 2019-04-19 MED ORDER — TAMSULOSIN HCL 0.4 MG PO CAPS: 0.4000 mg | ORAL_CAPSULE | Freq: Every day | ORAL | 3 refills | Status: DC

## 2019-04-19 NOTE — Patient Instructions (Signed)
Thank you for coming in today. I think you have a skin infection and may an infection of a pocket of blood/bruise.  If not better with oral doxycycline antibiotic return.  Also will do ultrasound for DVT evaluation today.   Recheck in about 1 week if not a lot better.  OK to use compression if you can tolerate it.    Cellulitis, Adult  Cellulitis is a skin infection. The infected area is often warm, red, swollen, and sore. It occurs most often in the arms and lower legs. It is very important to get treated for this condition. What are the causes? This condition is caused by bacteria. The bacteria enter through a break in the skin, such as a cut, burn, insect bite, open sore, or crack. What increases the risk? This condition is more likely to occur in people who:  Have a weak body defense system (immune system).  Have open cuts, burns, bites, or scrapes on the skin.  Are older than 61 years of age.  Have a blood sugar problem (diabetes).  Have a long-lasting (chronic) liver disease (cirrhosis) or kidney disease.  Are very overweight (obese).  Have a skin problem, such as: ? Itchy rash (eczema). ? Slow movement of blood in the veins (venous stasis). ? Fluid buildup below the skin (edema).  Have been treated with high-energy rays (radiation).  Use IV drugs. What are the signs or symptoms? Symptoms of this condition include:  Skin that is: ? Red. ? Streaking. ? Spotting. ? Swollen. ? Sore or painful when you touch it. ? Warm.  A fever.  Chills.  Blisters. How is this diagnosed? This condition is diagnosed based on:  Medical history.  Physical exam.  Blood tests.  Imaging tests. How is this treated? Treatment for this condition may include:  Medicines to treat infections or allergies.  Home care, such as: ? Rest. ? Placing cold or warm cloths (compresses) on the skin.  Hospital care, if the condition is very bad. Follow these instructions at home:  Medicines  Take over-the-counter and prescription medicines only as told by your doctor.  If you were prescribed an antibiotic medicine, take it as told by your doctor. Do not stop taking it even if you start to feel better. General instructions   Drink enough fluid to keep your pee (urine) pale yellow.  Do not touch or rub the infected area.  Raise (elevate) the infected area above the level of your heart while you are sitting or lying down.  Place cold or warm cloths on the area as told by your doctor.  Keep all follow-up visits as told by your doctor. This is important. Contact a doctor if:  You have a fever.  You do not start to get better after 1-2 days of treatment.  Your bone or joint under the infected area starts to hurt after the skin has healed.  Your infection comes back. This can happen in the same area or another area.  You have a swollen bump in the area.  You have new symptoms.  You feel ill and have muscle aches and pains. Get help right away if:  Your symptoms get worse.  You feel very sleepy.  You throw up (vomit) or have watery poop (diarrhea) for a long time.  You see red streaks coming from the area.  Your red area gets larger.  Your red area turns dark in color. These symptoms may represent a serious problem that is an emergency. Do  not wait to see if the symptoms will go away. Get medical help right away. Call your local emergency services (911 in the [place] Do not drive yourself to the hospital. Summary  Cellulitis is a skin infection. The area is often warm, red, swollen, and sore.  This condition is treated with medicines, rest, and cold and warm cloths.  Take all medicines only as told by your doctor.  Tell your doctor if symptoms do not start to get better after 1-2 days of treatment. This information is not intended to replace advice given to you by your health care provider. Make sure you discuss any questions you have with your  health care provider. Document Released: 01/14/2008 Document Revised: 12/17/2017 Document Reviewed: 12/17/2017 Elsevier Patient Education  2020 Elsevier Inc.    Seroma A seroma is a collection of fluid on the body that looks like swelling or a mass. Seromas form where tissue has been injured or cut. Seromas vary in size. Some are small and painless. Others may become large and cause pain or discomfort. Many seromas go away on their own as the fluid is naturally absorbed by the body, and some seromas need to be drained. What are the causes? Seromas form as the result of damage to tissue or the removal of tissue. This tissue damage may occur during surgery or because of an injury or trauma. When tissue is disrupted or removed, empty space is created. The body's natural defense system (immune system) causes fluid to enter the empty space and form a seroma. What are the signs or symptoms? Symptoms of this condition include:  Swelling at the site of a surgical cut (incision) or an injury.  Drainage of clear fluid at the surgery or injury site.  Discomfort or pain. How is this diagnosed? This condition is diagnosed based on your symptoms, your medical history, and a physical exam. During the exam, your health care provider will press on the seroma. You may also have tests, including:  Blood tests.  Imaging tests, such as an ultrasound or CT scan. How is this treated? Some seromas go away (resolve) on their own. Your health care provider may monitor you to make sure the seroma does not cause any complications. If your seroma does not resolve on its own, treatment may include:  Using a needle to drain the fluid from the seroma (needle aspiration).  Inserting a flexible tube (catheter) to drain the fluid.  Applying a bandage (dressing), such as an elastic bandage or binder.  Antibiotic medicines, if the seroma becomes infected. In rare cases, surgery may be done to remove the seroma and  repair the area. Follow these instructions at home:   If you were prescribed an antibiotic medicine, take it as told by your health care provider. Do not stop taking the antibiotic even if you start to feel better.  Return to your normal activities as told by your health care provider. Ask your health care provider what activities are safe for you.  Take over-the-counter and prescription medicines only as told by your health care provider.  Check your seroma every day for signs of infection. Check for: ? Redness or pain. ? Fluid or pus. ? More swelling. ? Warmth.  Keep all follow-up visits as told by your health care provider. This is important. Contact a health care provider if:  You have a fever.  You have redness or pain at the site of the seroma.  You have fluid or pus coming from the seroma.  Your seroma is more swollen or is getting bigger.  Your seroma is warm to the touch. This information is not intended to replace advice given to you by your health care provider. Make sure you discuss any questions you have with your health care provider. Document Released: 11/22/2012 Document Revised: 07/10/2017 Document Reviewed: 05/09/2016 Elsevier Patient Education  [employer].   I will be moving to full time Sports Medicine in [place] starting on November 1st.  You will still be able to see me for your Sports Medicine or Orthopedic needs at [facility] in [place]. I will still be part of [place].    If you want to stay locally for your Sports Medicine issues Dr. Dianah Field here in [place] will be happy to see you.  Additionally Dr. Clearance Coots at [facility] will be happy to see you for sports medicine issues more locally.   For your primary care needs you are welcome to establish care with Dr. Emeterio Reeve.  We are working quickly to hire more physicians to cover the primary care needs however if you cannot get an  appointment with Dr. Sheppard Coil in a timely manner [facility] has locations and openings for primary care services nearby.   [facility] at [facility] [address] . [employer] , [place]: [phone] . Behavioral Medicine: [phone] . Fax: [place] at [employer] [address] . [place], [place]: [phone] . Behavioral Medicine: [phone] . Fax: [phone] . Hours (M-F): 7am - Academic librarian At [facility]. [place], [place]: [phone] . Behavioral Medicine: [phone] . Fax: [phone] . Hours (M-F): 8am - Optician, dispensing at [employer] . [place], [place] Phone: [phone] . Behavioral Medicine: [phone] . Fax: [phone]

## 2019-04-19 NOTE — Progress Notes (Signed)
Jordan Coffey is a [age] y.o. male who [name] today for left leg injury.  Patient was mowing the lawn on Friday, September 4.  He was struck in the leg by an object.  The object caused a skin abrasion and contusion.  He noted some swelling.  He noted the skin became red and painful about 2 days ago.  He is developed some leg swelling since.  He notes pain with some ambulation but overall feels pretty well.  No fevers or chills nausea vomiting or diarrhea.  He denies any significant calf tenderness.     ROS as above:  Exam:  BP 112/72   Pulse 99   Temp 98.3 F (36.8 C) (Oral)   Wt 168 lb (76.2 kg)   BMI 21.57 kg/m  Wt Readings from Last 5 Encounters:  04/19/19 168 lb (76.2 kg)  09/10/18 193 lb (87.5 kg)  07/15/18 191 lb (86.6 kg)  06/15/18 189 lb (85.7 kg)  06/24/17 189 lb 1.9 oz (85.8 kg)    Gen: Well NAD HEENT: EOMI,  MMM Lungs: Normal work of breathing. CTABL Heart: RRR no MRG Abd: NABS, Soft. Nondistended, Nontender Exts: Brisk capillary refill, warm and well perfused.  Soft tissue swelling left lower extremity with no pitting edema however.  Edema 1+ nonpitting. Abrasion present at medial calf with skin erythema and tenderness surrounding.  No soft tissue fluctuance palpated.  No expressible pus. Calf with increased varicosities but no palpable cords and negative cast wheeze test.  Lab and Radiology Results Limited musculoskeletal ultrasound left medial calf reveals hypoechoic fluid measuring about 1 cm collecting superficial to bone cortex at area of abrasion indicating seroma.  Subcutaneous tissue marbling also present surrounding indicating cellulitis. Impression: Seroma with likely surrounding cellulitis.  Assessment and Plan: 63 y.o. male with left leg pain and swelling after impact.  Likely seroma.  Patient has evidence of cellulitis.   I am not sure if the seroma is septic or not yet.  Discussed options.  Patient would like to avoid incision and drainage if possible.  Trial of oral antibiotics.  If not improving will return to clinic for incision and drainage.  Also patient does have some soft tissue swelling and leg swelling with increased varicosities concerning for possible DVT.  Plan for duplex ultrasound to rule out DVT.  Chronic medications refilled. Recheck if not improving shortly.  Flu vaccine given today prior to discharge.  I informed patient that I am transitioning to sports medicine only [name] in [place] starting in November.  Happy to see patient for continued sports medicine needs.  Discussed need for new PCP.  Provided some recommendations.   PDMP not reviewed this encounter. Orders Placed This Encounter  Procedures  . US Venous Img Lower Unilateral Left    Standing Status:   Future    Standing Expiration Date:   06/18/2020    Order Specific Question:   Reason for Exam (SYMPTOM  OR DIAGNOSIS REQUIRED)    Answer:   eval leg swelling    Order Specific Question:   Preferred imaging location?    Answer:   [name]   Meds ordered this encounter  Medications  . doxycycline (VIBRA-TABS) 100 MG tablet    Sig: Take 1 tablet (100 mg total) by mouth 2 (two) times daily.    Dispense:  20 tablet    Refill:  0  . atorvastatin (LIPITOR) 40 MG tablet  Sig: Take 1 tablet (40 mg total) by mouth daily.    Dispense:  90 tablet    Refill:  3  . lisinopril-hydrochlorothiazide (ZESTORETIC) 20-12.5 MG tablet    Sig: Take 1 tablet by mouth daily.    Dispense:  90 tablet    Refill:  3  . tamsulosin (FLOMAX) 0.4 MG CAPS capsule    Sig: Take 1 capsule (0.4 mg total) by mouth daily.    Dispense:  90 capsule    Refill:  3     Historical information moved to improve visibility of documentation.  Past Medical History:  Diagnosis Date  . HLD (hyperlipidemia) 06/15/2018  .  Hypertension    Past Surgical History:  Procedure Laterality Date  . Arm surgery    . arthroscopic knee surgery     Social History   Tobacco Use  . Smoking status: Current Some Day Smoker    Types: Cigars  . Smokeless tobacco: Current User  Substance Use Topics  . Alcohol use: Yes    Comment: Occasional   family history includes Diabetes in his mother; Hypertension in his father and mother.  Medications: Current Outpatient Medications  Medication Sig Dispense Refill  . atorvastatin (LIPITOR) 40 MG tablet Take 1 tablet (40 mg total) by mouth daily. 90 tablet 3  . ibuprofen (ADVIL,MOTRIN) 200 MG tablet Take 200 mg by mouth every 6 (six) hours as needed.    Marland Kitchen lisinopril-hydrochlorothiazide (ZESTORETIC) 20-12.5 MG tablet Take 1 tablet by mouth daily. 90 tablet 3  . Multiple Vitamins-Minerals (MULTIVITAMIN ADULT PO) Take by mouth.    . tamsulosin (FLOMAX) 0.4 MG CAPS capsule Take 1 capsule (0.4 mg total) by mouth daily. 90 capsule 3  . doxycycline (VIBRA-TABS) 100 MG tablet Take 1 tablet (100 mg total) by mouth 2 (two) times daily. 20 tablet 0   No current facility-administered medications for this visit.    No Known Allergies   Discussed warning signs or symptoms. Please see discharge instructions. Patient expresses understanding.

## 2019-04-26 ENCOUNTER — Other Ambulatory Visit: Payer: Self-pay

## 2019-04-26 ENCOUNTER — Encounter: Payer: Self-pay | Admitting: Family Medicine

## 2019-04-26 ENCOUNTER — Ambulatory Visit (INDEPENDENT_AMBULATORY_CARE_PROVIDER_SITE_OTHER): Payer: BC Managed Care – PPO | Admitting: Family Medicine

## 2019-04-26 VITALS — BP 138/72 | HR 92 | Ht 74.0 in | Wt 172.0 lb

## 2019-04-26 DIAGNOSIS — T792XXA Traumatic secondary and recurrent hemorrhage and seroma, initial encounter: Secondary | ICD-10-CM

## 2019-04-26 DIAGNOSIS — S39012A Strain of muscle, fascia and tendon of lower back, initial encounter: Secondary | ICD-10-CM | POA: Diagnosis not present

## 2019-04-26 MED ORDER — DOXYCYCLINE HYCLATE 100 MG PO TABS: 100.0000 mg | ORAL_TABLET | Freq: Two times a day (BID) | ORAL | 0 refills | Status: DC

## 2019-04-26 MED ORDER — CYCLOBENZAPRINE HCL 10 MG PO TABS: 10.0000 mg | ORAL_TABLET | Freq: Three times a day (TID) | ORAL | 0 refills | Status: DC | PRN

## 2019-04-26 NOTE — Progress Notes (Signed)
Jordan Coffey is a 63 y.o. male who presents to [facility] today for follow-up leg pain and swelling.  Patient was struck in the left leg on Friday, September 4 but an object while mowing the lawn.  He was seen on September 8 for pain and swelling thought to have cellulitis with possible seroma.  Proceeded with duplex ultrasound which ruled out DVT but also treated with doxycycline for possible cellulitis.  In the interim Brittany notes the redness and pain has diminished quite a bit however the swelling is still persistent.  Additionally he notes that he injured his low back over the weekend.  He was catching a small grandchild who was running past and developed a pulling sensation in his left low back.  This is present and slowly improving.  He denies any radiating pain weakness or numbness.  Has had episodes like this in the past and notes that he is done well with muscle relaxers.  He is tried some over-the-counter medications for pain which helped a little.   ROS as above:  Exam:  BP 138/72   Pulse 92   Ht 6\' 2"  (1.88 m)   Wt 172 lb (78 kg)   BMI 22.08 kg/m  Wt Readings from Last 5 Encounters:  04/26/19 172 lb (78 kg)  04/19/19 168 lb (76.2 kg)  09/10/18 193 lb (87.5 kg)  07/15/18 191 lb (86.6 kg)  06/15/18 189 lb (85.7 kg)    Gen: Well NAD HEENT: EOMI,  MMM Lungs: Normal work of breathing. CTABL Heart: RRR no MRG Abd: NABS, Soft. Nondistended, Nontender Exts: Brisk capillary refill, warm and well perfused.  L-spine: Normal motion normal gait. Left leg: Swollen with soft fluctuance at medial distal tibia.  Skin overlying area of fluctuance has whitish abrasion with some granulation tissue.  No surrounding erythema or induration.  Mildly tender to palpation.  Incision and drainage seroma left leg: Consent obtained and timeout performed.  Skin cleaned with  rubbing alcohol, cold spray applied, and 2 mL of lidocaine injected subcutaneously achieving good anesthesia. Skin was sterilized with chlorhexidine and sharp incision was made to the seroma.  Clear fluid drained and was cultured. The entire structure was drained and antibiotic ointment and a dressing was applied. Patient tolerated the procedure well.  Assessment and Plan: 63 y.o. male with seroma left lower leg.  Drained easily today.  Plan to extend antibiotic course and obtain culture of fluid.  Recheck if not improving in near future.  Additionally patient has suffered a low back strain.  Plan to treat with relative rest home exercise program, and cyclobenzaprine.  Also recommend using heating pad.  Recheck as needed.  PDMP not reviewed this encounter. Orders Placed This Encounter  Procedures  . Anaerobic and Aerobic Culture   Meds ordered this encounter  Medications  . doxycycline (VIBRA-TABS) 100 MG tablet    Sig: Take 1 tablet (100 mg total) by mouth 2 (two) times daily.    Dispense:  14 tablet    Refill:  0  . cyclobenzaprine (FLEXERIL) 10 MG tablet    Sig: Take 1 tablet (10 mg total) by mouth 3 (three) times daily as needed for muscle spasms.    Dispense:  30 tablet    Refill:  0     Historical information moved to improve visibility of documentation.  Past Medical History:  Diagnosis Date  . HLD (hyperlipidemia) 06/15/2018  . Hypertension    Past  Surgical History:  Procedure Laterality Date  . Arm surgery    . arthroscopic knee surgery     Social History   Tobacco Use  . Smoking status: Current Some Day Smoker    Types: Cigars  . Smokeless tobacco: Current User  Substance Use Topics  . Alcohol use: Yes    Comment: Occasional   family history includes Diabetes in his mother; Hypertension in his father and mother.  Medications: Current Outpatient Medications  Medication Sig Dispense Refill  . atorvastatin (LIPITOR) 40 MG tablet Take 1 tablet (40 mg total)  by mouth daily. 90 tablet 3  . doxycycline (VIBRA-TABS) 100 MG tablet Take 1 tablet (100 mg total) by mouth 2 (two) times daily. 14 tablet 0  . ibuprofen (ADVIL,MOTRIN) 200 MG tablet Take 200 mg by mouth every 6 (six) hours as needed.    Marland Kitchen. lisinopril-hydrochlorothiazide (ZESTORETIC) 20-12.5 MG tablet Take 1 tablet by mouth daily. 90 tablet 3  . Multiple Vitamins-Minerals (MULTIVITAMIN ADULT PO) Take by mouth.    . tamsulosin (FLOMAX) 0.4 MG CAPS capsule Take 1 capsule (0.4 mg total) by mouth daily. 90 capsule 3  . cyclobenzaprine (FLEXERIL) 10 MG tablet Take 1 tablet (10 mg total) by mouth 3 (three) times daily as needed for muscle spasms. 30 tablet 0   No current facility-administered medications for this visit.    No Known Allergies   Discussed warning signs or symptoms. Please see discharge instructions. Patient expresses understanding.

## 2019-04-26 NOTE — Patient Instructions (Signed)
Thank you for coming in today. Keep the wound covered with ointment Return as needed.  It should heal up in about 1-2 weeks.  Extend doxycycline for 1 more week.  Use flexeril muscle releaxer for back pain as needed.  It may make you sleepy.  Also try a heating pad.  Return sooner if needed.

## 2019-05-02 LAB — ANAEROBIC AND AEROBIC CULTURE
MICRO NUMBER:: 883331
MICRO NUMBER:: 883332
SPECIMEN QUALITY:: ADEQUATE
SPECIMEN QUALITY:: ADEQUATE

## 2019-06-27 ENCOUNTER — Other Ambulatory Visit: Payer: Self-pay

## 2019-06-27 MED ORDER — LISINOPRIL-HYDROCHLOROTHIAZIDE 20-12.5 MG PO TABS: 1.0000 | ORAL_TABLET | Freq: Every day | ORAL | 0 refills | Status: DC

## 2019-07-18 ENCOUNTER — Encounter: Payer: Self-pay | Admitting: Family Medicine

## 2019-07-18 ENCOUNTER — Encounter: Payer: BC Managed Care – PPO | Admitting: Osteopathic Medicine

## 2019-08-29 ENCOUNTER — Other Ambulatory Visit: Payer: Self-pay

## 2019-08-29 ENCOUNTER — Ambulatory Visit: Payer: BC Managed Care – PPO | Admitting: Osteopathic Medicine

## 2019-08-29 ENCOUNTER — Encounter: Payer: Self-pay | Admitting: Osteopathic Medicine

## 2019-08-29 VITALS — BP 115/72 | HR 92 | Temp 98.0°F | Wt 169.0 lb

## 2019-08-29 DIAGNOSIS — E782 Mixed hyperlipidemia: Secondary | ICD-10-CM | POA: Diagnosis not present

## 2019-08-29 DIAGNOSIS — Z0000 Encounter for general adult medical examination without abnormal findings: Secondary | ICD-10-CM

## 2019-08-29 DIAGNOSIS — I10 Essential (primary) hypertension: Secondary | ICD-10-CM

## 2019-08-29 DIAGNOSIS — R350 Frequency of micturition: Secondary | ICD-10-CM

## 2019-08-29 DIAGNOSIS — Z23 Encounter for immunization: Secondary | ICD-10-CM | POA: Diagnosis not present

## 2019-08-29 DIAGNOSIS — N401 Enlarged prostate with lower urinary tract symptoms: Secondary | ICD-10-CM

## 2019-08-29 MED ORDER — TAMSULOSIN HCL 0.4 MG PO CAPS: 0.4000 mg | ORAL_CAPSULE | Freq: Every day | ORAL | 3 refills | Status: DC

## 2019-08-29 MED ORDER — LISINOPRIL-HYDROCHLOROTHIAZIDE 20-12.5 MG PO TABS: 1.0000 | ORAL_TABLET | Freq: Every day | ORAL | 3 refills | Status: DC

## 2019-08-29 MED ORDER — ATORVASTATIN CALCIUM 40 MG PO TABS: 40.0000 mg | ORAL_TABLET | Freq: Every day | ORAL | 3 refills | Status: DC

## 2019-08-29 NOTE — Patient Instructions (Addendum)
General Preventive Care  Most recent routine screening lipids/other labs: done today   Everyone should have blood pressure checked once per year.   Tobacco: don't! Please let me know if you need help quitting!  Alcohol: responsible moderation is ok for most adults - if you have concerns about your alcohol intake, please talk to me!   Exercise: as tolerated to reduce risk of cardiovascular disease and diabetes. Strength training will also prevent osteoporosis.   Mental health: if need for mental health care (medicines, counseling, other), or concerns about moods, please let me know!   Sexual health: if need for STD testing, or if concerns with libido/pain problems, please let me know!   Advanced Directive: Living Will and/or Healthcare Power of Attorney recommended for all adults, regardless of age or health.  Vaccines  Flu vaccine: recommended for almost everyone, every fall.   Shingles vaccine: Shingrix recommended after age 20.   Pneumonia vaccines: Prevnar and Pneumovax recommended after age 67.   Tetanus booster: Tdap recommended every 10 years. Due 2027.  Cancer screenings   Colon cancer screening: repeat due 08/2021  Prostate cancer screening: PSA blood test around age 76-71  Lung cancer screening: CT chest every year for those sge 32 to 62 years old with ?30 pack year cigarette smoking history, who either currently smoke or have quit within the past 15 years. Infection screenings . HIV: recommended screening at least once age 22-65, more often as needed. Done 2017 . Gonorrhea/Chlamydia: screening as needed.  . Hepatitis C: recommended for anyone born 41-1963, done 2018 . TB: certain at-risk populations, or depending on work requirements and/or travel history Other . Bone Density Test: recommended for men at age 17, sooner depending on risk factors . Abdominal Aortic Aneurysm: screening with ultrasound recommended once for men age 54-75 who have ever smoked 100+  cigarettes

## 2019-08-29 NOTE — Progress Notes (Signed)
Jordan Coffey is a 64 y.o. male who presents to  [place] [facility]  today, 08/29/19, seeking care for the following:  The primary encounter diagnosis was Annual physical exam. Diagnoses of Essential hypertension, Mixed hyperlipidemia, Benign prostatic hyperplasia with urinary frequency, Need for shingles vaccine, and Frequency of urination were also pertinent to this visit.    New concern: . none    Chronic, stable: . HTN - BP at goal, due for labs  . HLD - due for labs    Chronic with exacerbation:  . none    ASSESSMENT & PLAN with other pertinent history/findings:   Annual physical exam - No concerns on detailed physical exam. Plan: CBC, COMPLETE METABOLIC PANEL WITH GFR, LIPID SCREENING, PSA SOLSTAS  Essential hypertension - Plan: CBC, COMPLETE METABOLIC PANEL WITH GFR, LIPID SCREENING, PSA SOLSTAS  Mixed hyperlipidemia - Plan: CBC, COMPLETE METABOLIC PANEL WITH GFR, LIPID SCREENING, PSA SOLSTAS  Benign prostatic hyperplasia with urinary frequency - Plan: CBC, COMPLETE METABOLIC PANEL WITH GFR, LIPID SCREENING, PSA SOLSTAS  Need for shingles vaccine      Orders Placed This Encounter  Procedures  . Varicella-zoster vaccine IM (Shingrix)  . CBC  . COMPLETE METABOLIC PANEL WITH GFR  . LIPID SCREENING  . PSA SOLSTAS    Meds ordered this encounter  Medications  . atorvastatin (LIPITOR) 40 MG tablet    Sig: Take 1 tablet (40 mg total) by mouth daily.    Dispense:  90 tablet    Refill:  3  . tamsulosin (FLOMAX) 0.4 MG CAPS capsule    Sig: Take 1 capsule (0.4 mg total) by mouth daily.    Dispense:  90 capsule    Refill:  3  . lisinopril-hydrochlorothiazide (ZESTORETIC) 20-12.5 MG tablet    Sig: Take 1 tablet by mouth daily.    Dispense:  90 tablet    Refill:  3    Patient Instructions  General Preventive Care  Most recent routine screening lipids/other labs: done today   Everyone should have blood  pressure checked once per year.   Tobacco: don't! Please let me know if you need help quitting!  Alcohol: responsible moderation is ok for most adults - if you have concerns about your alcohol intake, please talk to me!   Exercise: as tolerated to reduce risk of cardiovascular disease and diabetes. Strength training will also prevent osteoporosis.   Mental health: if need for mental health care (medicines, counseling, other), or concerns about moods, please let me know!   Sexual health: if need for STD testing, or if concerns with libido/pain problems, please let me know!   Advanced Directive: Living Will and/or Healthcare Power of Attorney recommended for all adults, regardless of age or health.  Vaccines  Flu vaccine: recommended for almost everyone, every fall.   Shingles vaccine: Shingrix recommended after age 44.   Pneumonia vaccines: Prevnar and Pneumovax recommended after age 55.   Tetanus booster: Tdap recommended every 10 years. Due 2027.  Cancer screenings   Colon cancer screening: repeat due 08/2021  Prostate cancer screening: PSA blood test around age 59-71  Lung cancer screening: CT chest every year for those sge 77 to 63 years old with ?30 pack year cigarette smoking history, who either currently smoke or have quit within the past 15 years. Infection screenings . HIV: recommended screening at least once age 4-65, more often as needed. Done 2017 . Gonorrhea/Chlamydia: screening as needed.  . Hepatitis C: recommended for anyone  born 26-1963, done 2018 . TB: certain at-risk populations, or depending on work requirements and/or travel history Other . Bone Density Test: recommended for men at age 23, sooner depending on risk factors . Abdominal Aortic Aneurysm: screening with ultrasound recommended once for men age 25-75 who have ever smoked 100+ cigarettes        Follow-up instructions: Return in about 1 year (around 08/28/2020) for [place] (call week prior to  visit for lab orders).       BP 115/72 (BP Location: Left Arm, Patient Position: Sitting, Cuff Size: Normal)   Pulse 92   Temp 98 F (36.7 C) (Oral)   Wt 169 lb 0.6 oz (76.7 kg)   BMI 21.70 kg/m   Current Meds  Medication Sig  . atorvastatin (LIPITOR) 40 MG tablet Take 1 tablet (40 mg total) by mouth daily.  . cyclobenzaprine (FLEXERIL) 10 MG tablet Take 1 tablet (10 mg total) by mouth 3 (three) times daily as needed for muscle spasms.  Marland Kitchen ibuprofen (ADVIL,MOTRIN) 200 MG tablet Take 200 mg by mouth every 6 (six) hours as needed.  Marland Kitchen lisinopril-hydrochlorothiazide (ZESTORETIC) 20-12.5 MG tablet Take 1 tablet by mouth daily.  . Multiple Vitamins-Minerals (MULTIVITAMIN ADULT PO) Take by mouth.  . tamsulosin (FLOMAX) 0.4 MG CAPS capsule Take 1 capsule (0.4 mg total) by mouth daily.  . [DISCONTINUED] doxycycline (VIBRA-TABS) 100 MG tablet Take 1 tablet (100 mg total) by mouth 2 (two) times daily.    No results found for this or any previous visit (from the past 72 hour(s)).  No results found.  Depression screen [facility] 2/9 08/29/2019 07/15/2018 06/24/2017  Decreased Interest 2 0 0  Down, Depressed, Hopeless 1 0 0  PHQ - 2 Score 3 0 0  Altered sleeping 2 - -  Tired, decreased energy 0 - -  Change in appetite 1 - -  Feeling bad or failure about yourself  0 - -  Trouble concentrating 1 - -  Moving slowly or fidgety/restless 0 - -  Suicidal thoughts 0 - -  PHQ-9 Score 7 - -  Difficult doing work/chores Somewhat difficult - -    GAD 7 : Generalized Anxiety Score 08/29/2019  Nervous, Anxious, on Edge 1  Control/stop worrying 2  Worry too much - different things 2  Trouble relaxing 2  Restless 1  Easily annoyed or irritable 2  Afraid - awful might happen 1  Total GAD 7 Score 11  Anxiety Difficulty Somewhat difficult      All questions at time of visit were answered - patient instructed to contact office with any additional concerns or updates.  ER/RTC precautions were reviewed  with the patient.  Please note: voice recognition software was used to produce this document, and typos may escape review. Please contact Dr. Lyn Hollingshead for any needed clarifications.

## 2019-09-01 LAB — COMPLETE METABOLIC PANEL WITH GFR
AG Ratio: 1.5 (calc) (ref 1.0–2.5)
ALT: 15 U/L (ref 9–46)
AST: 19 U/L (ref 10–35)
Albumin: 4 g/dL (ref 3.6–5.1)
Alkaline phosphatase (APISO): 62 U/L (ref 35–144)
BUN: 14 mg/dL (ref 7–25)
CO2: 26 mmol/L (ref 20–32)
Calcium: 9.1 mg/dL (ref 8.6–10.3)
Chloride: 105 mmol/L (ref 98–110)
Creat: 1.09 mg/dL (ref 0.70–1.25)
GFR, Est African American: 84 mL/min/{1.73_m2} (ref >=60)
GFR, Est Non African American: 73 mL/min/{1.73_m2} (ref >=60)
Globulin: 2.7 g/dL (calc) (ref 1.9–3.7)
Glucose, Bld: 97 mg/dL (ref 65–99)
Potassium: 4 mmol/L (ref 3.5–5.3)
Sodium: 139 mmol/L (ref 135–146)
Total Bilirubin: 0.4 mg/dL (ref 0.2–1.2)
Total Protein: 6.7 g/dL (ref 6.1–8.1)

## 2019-09-01 LAB — CBC
HCT: 40.8 % (ref 38.5–50.0)
Hemoglobin: 13.7 g/dL (ref 13.2–17.1)
MCH: 31.2 pg (ref 27.0–33.0)
MCHC: 33.6 g/dL (ref 32.0–36.0)
MCV: 92.9 fL (ref 80.0–100.0)
MPV: 10.5 fL (ref 7.5–12.5)
Platelets: 192 10*3/uL (ref 140–400)
RBC: 4.39 10*6/uL (ref 4.20–5.80)
RDW: 13.3 % (ref 11.0–15.0)
WBC: 8.3 10*3/uL (ref 3.8–10.8)

## 2019-09-01 LAB — LIPID PANEL
Cholesterol: 127 mg/dL (ref <200)
HDL: 60 mg/dL (ref >=40)
LDL Cholesterol (Calc): 53 mg/dL (calc)
Non-HDL Cholesterol (Calc): 67 mg/dL (calc) (ref <130)
Total CHOL/HDL Ratio: 2.1 (calc) (ref <5.0)
Triglycerides: 65 mg/dL (ref <150)

## 2019-09-01 LAB — PSA, TOTAL WITH REFLEX TO PSA, FREE: PSA, Total: 0.9 ng/mL (ref <=4.0)

## 2019-11-29 ENCOUNTER — Encounter: Payer: Self-pay | Admitting: Nurse Practitioner

## 2019-11-29 ENCOUNTER — Ambulatory Visit: Payer: BC Managed Care – PPO | Admitting: Nurse Practitioner

## 2019-11-29 ENCOUNTER — Other Ambulatory Visit: Payer: Self-pay

## 2019-11-29 VITALS — BP 141/80 | HR 92 | Temp 98.2°F | Ht 74.0 in | Wt 167.0 lb

## 2019-11-29 DIAGNOSIS — N3001 Acute cystitis with hematuria: Secondary | ICD-10-CM | POA: Diagnosis not present

## 2019-11-29 DIAGNOSIS — M62838 Other muscle spasm: Secondary | ICD-10-CM

## 2019-11-29 LAB — POCT URINALYSIS DIP (CLINITEK)
Bilirubin, UA: NEGATIVE
Glucose, UA: NEGATIVE mg/dL
Ketones, POC UA: NEGATIVE mg/dL
Nitrite, UA: NEGATIVE
POC PROTEIN,UA: NEGATIVE
Spec Grav, UA: 1.025 (ref 1.010–1.025)
Urobilinogen, UA: 0.2 E.U./dL
pH, UA: 6.5 (ref 5.0–8.0)

## 2019-11-29 MED ORDER — CIPROFLOXACIN HCL 500 MG PO TABS: 500.0000 mg | ORAL_TABLET | Freq: Two times a day (BID) | ORAL | 0 refills | Status: DC

## 2019-11-29 MED ORDER — CYCLOBENZAPRINE HCL 10 MG PO TABS: 10.0000 mg | ORAL_TABLET | Freq: Three times a day (TID) | ORAL | 1 refills | Status: DC | PRN

## 2019-11-29 NOTE — Progress Notes (Signed)
Acute Office Visit  Subjective:    Patient ID: Jordan Coffey, male    DOB: [birth year], 64 y.o.   MRN: [id number]  Chief Complaint  Patient presents with  . Dysuria    Onset:3 weeks, frequency, urgency, incomplete void, burning, has not been taking anything OTC     HPI Patient is in today for lower urinary tract symptoms including increased urinary frequency, urgency, dysuria, and sensation of incomplete voiding. His symptoms have been present for approximately 3 weeks and are increasing. He had similar symptoms a couple of years ago and had a UTI with E.coli. He has not tried anything to help.   Denies fever, chills, back pain, abdominal pain, nocturia, nausea, or vomiting. Endorses dysuria, urgency, frequency, incomplete emptying, decreased appetite, and fatigue.   ABDOMINAL MUSCLE SPASMS He also reports starting back in the gym recently and working out with the abdominal machine when he began to experience left side abdominal pain that follows the musculature around the flank.  He reports tenderness and spasm in the muscle that is preventing him from stretching. His symptoms have been present for about two days. He has not tried anything to help. Movement makes it worse.   Past Medical History:  Diagnosis Date  . HLD (hyperlipidemia) 06/15/2018  . Hypertension     Past Surgical History:  Procedure Laterality Date  . Arm surgery    . arthroscopic knee surgery      Family History  Problem Relation Age of Onset  . Diabetes Mother   . Hypertension Mother   . Hypertension Father     Social History   Socioeconomic History  . Marital status: Married    Spouse name: Not on file  . Number of children: 3  . Years of education: Not on file  . Highest education level: Not on file  Occupational History    Comment: Retired  Tobacco Use  . Smoking status: Current Some Day Smoker    Types: Cigars  . Smokeless tobacco: Current User  Substance and Sexual Activity  . Alcohol use:  Yes    Comment: Occasional  . Drug use: No  . Sexual activity: Not on file  Other Topics Concern  . Not on file  Social History Narrative  . Not on file   Social Determinants of Health   Financial Resource Strain:   . Difficulty of Paying Living Expenses:   Food Insecurity:   . Worried About Programme researcher, broadcasting/film/video in the Last Year:   . Barista in the Last Year:   Transportation Needs:   . Freight forwarder (Medical):   Marland Kitchen Lack of Transportation (Non-Medical):   Physical Activity:   . Days of Exercise per Week:   . Minutes of Exercise per Session:   Stress:   . Feeling of Stress :   Social Connections:   . Frequency of Communication with Friends and Family:   . Frequency of Social Gatherings with Friends and Family:   . Attends Religious Services:   . Active Member of Clubs or Organizations:   . Attends Banker Meetings:   Marland Kitchen Marital Status:   Intimate Partner Violence:   . Fear of Current or Ex-Partner:   . Emotionally Abused:   Marland Kitchen Physically Abused:   . Sexually Abused:     Outpatient Medications Prior to Visit  Medication Sig Dispense Refill  . atorvastatin (LIPITOR) 40 MG tablet Take 1 tablet (40 mg total) by mouth daily. 90 tablet 3  .  cyclobenzaprine (FLEXERIL) 10 MG tablet Take 1 tablet (10 mg total) by mouth 3 (three) times daily as needed for muscle spasms. 30 tablet 0  . ibuprofen (ADVIL,MOTRIN) 200 MG tablet Take 200 mg by mouth every 6 (six) hours as needed.    Marland Kitchen lisinopril-hydrochlorothiazide (ZESTORETIC) 20-12.5 MG tablet Take 1 tablet by mouth daily. 90 tablet 3  . Multiple Vitamins-Minerals (MULTIVITAMIN ADULT PO) Take by mouth.    . tamsulosin (FLOMAX) 0.4 MG CAPS capsule Take 1 capsule (0.4 mg total) by mouth daily. 90 capsule 3   No facility-administered medications prior to visit.    No Known Allergies  Review of Systems  Constitutional: Positive for activity change, appetite change and fatigue. Negative for chills,  diaphoresis and fever.  Respiratory: Negative for chest tightness and shortness of breath.   Cardiovascular: Negative for chest pain and palpitations.  Gastrointestinal: Positive for abdominal pain. Negative for abdominal distention, constipation, diarrhea, nausea and vomiting.  Genitourinary: Positive for decreased urine volume, dysuria, flank pain, frequency and urgency. Negative for discharge, genital sores, hematuria, penile pain, penile swelling, scrotal swelling and testicular pain.  Musculoskeletal: Positive for myalgias. Negative for back pain.  Skin: Negative for color change and rash.  Neurological: Negative for dizziness, weakness, light-headedness and headaches.  Psychiatric/Behavioral: Negative for confusion, dysphoric mood and sleep disturbance. The patient is not nervous/anxious.        Objective:    Physical Exam Vitals and nursing note reviewed.  Constitutional:      Appearance: Normal appearance. He is normal weight.  HENT:     Head: Normocephalic.  Eyes:     Extraocular Movements: Extraocular movements intact.     Conjunctiva/sclera: Conjunctivae normal.     Pupils: Pupils are equal, round, and reactive to light.  Cardiovascular:     Rate and Rhythm: Normal rate and regular rhythm.     Pulses: Normal pulses.     Heart sounds: Normal heart sounds.  Pulmonary:     Effort: Pulmonary effort is normal.     Breath sounds: Normal breath sounds.  Abdominal:     General: Abdomen is flat. Bowel sounds are normal.     Palpations: Abdomen is soft.     Tenderness: There is abdominal tenderness. There is no right CVA tenderness or left CVA tenderness.    Musculoskeletal:        General: Tenderness present.     Cervical back: Normal range of motion.     Right lower leg: No edema.     Left lower leg: No edema.  Skin:    General: Skin is warm and dry.     Capillary Refill: Capillary refill takes less than 2 seconds.  Neurological:     General: No focal deficit present.      Mental Status: He is alert and oriented to person, place, and time.     Motor: No weakness.  Psychiatric:        Mood and Affect: Mood normal.        Behavior: Behavior normal.        Thought Content: Thought content normal.        Judgment: Judgment normal.     Ht 6\' 2"  (1.88 m)   Wt 167 lb (75.8 kg)   BMI 21.44 kg/m  Wt Readings from Last 3 Encounters:  11/29/19 167 lb (75.8 kg)  08/29/19 169 lb 0.6 oz (76.7 kg)  04/26/19 172 lb (78 kg)    There are no preventive care reminders to display for  this patient.  There are no preventive care reminders to display for this patient.   Lab Results  Component Value Date   TSH 2.21 04/21/2016   Lab Results  Component Value Date   WBC 8.3 08/31/2019   HGB 13.7 08/31/2019   HCT 40.8 08/31/2019   MCV 92.9 08/31/2019   PLT 192 08/31/2019   Lab Results  Component Value Date   NA 139 08/31/2019   K 4.0 08/31/2019   CO2 26 08/31/2019   GLUCOSE 97 08/31/2019   BUN 14 08/31/2019   CREATININE 1.09 08/31/2019   BILITOT 0.4 08/31/2019   ALKPHOS 63 04/21/2016   AST 19 08/31/2019   ALT 15 08/31/2019   PROT 6.7 08/31/2019   ALBUMIN 4.2 04/21/2016   CALCIUM 9.1 08/31/2019   ANIONGAP 8 04/16/2016   Lab Results  Component Value Date   CHOL 127 08/31/2019   Lab Results  Component Value Date   HDL 60 08/31/2019   Lab Results  Component Value Date   LDLCALC 53 08/31/2019   Lab Results  Component Value Date   TRIG 65 08/31/2019   Lab Results  Component Value Date   CHOLHDL 2.1 08/31/2019   Lab Results  Component Value Date   HGBA1C 5.2 04/21/2016       Assessment & Plan:   1. Acute cystitis with hematuria Symptoms and presentation consistent with acute cystitis with hematuria.  Point-of-care urinalysis today revealed the presence of moderate blood and moderate leukocytes.  Past urine culture revealed positive for E. Coli-we will send culture today. We will treat with ciprofloxacin 500 mg twice daily for 5  days. Patient instructed to avoid exercise while taking this medication to avoid the risk of tendon rupture.  Patient also instructed to increase fluid intake. Patient to follow-up if symptoms worsen or fail to improve. - POCT URINALYSIS DIP (CLINITEK) - Urine Culture - ciprofloxacin (CIPRO) 500 MG tablet; Take 1 tablet (500 mg total) by mouth 2 (two) times daily.  Dispense: 10 tablet; Refill: 0  2. Muscle spasm Symptoms and presentation consistent with muscle spasm in the left abdominal muscles.  Patient instructed to perform gentle stretching exercises in addition to heat and ice to the area intermittently.  Prescription for Flexeril provided to aid in stretching and decrease pain. Patient instructed to follow-up if symptoms worsen or fail to improve within 1 week. - cyclobenzaprine (FLEXERIL) 10 MG tablet; Take 1 tablet (10 mg total) by mouth 3 (three) times daily as needed for muscle spasms.  Dispense: 30 tablet; Refill: 1  Return if symptoms worsen or fail to improve.   Tollie Eth, NP

## 2019-11-29 NOTE — Patient Instructions (Signed)
Urinary Tract Infection, Adult A urinary tract infection (UTI) is an infection of any part of the urinary tract. The urinary tract includes:  The kidneys.  The ureters.  The bladder.  The urethra. These organs make, store, and get rid of pee (urine) in the body. What are the causes? This is caused by germs (bacteria) in your genital area. These germs grow and cause swelling (inflammation) of your urinary tract. What increases the risk? You are more likely to develop this condition if:  You have a small, thin tube (catheter) to drain pee.  You cannot control when you pee or poop (incontinence).  You are male, and: ? You use these methods to prevent pregnancy:  A medicine that kills sperm (spermicide).  A device that blocks sperm (diaphragm). ? You have low levels of a male hormone (estrogen). ? You are pregnant.  You have genes that add to your risk.  You are sexually active.  You take antibiotic medicines.  You have trouble peeing because of: ? A prostate that is bigger than normal, if you are male. ? A blockage in the part of your body that drains pee from the bladder (urethra). ? A kidney stone. ? A nerve condition that affects your bladder (neurogenic bladder). ? Not getting enough to drink. ? Not peeing often enough.  You have other conditions, such as: ? Diabetes. ? A weak disease-fighting system (immune system). ? Sickle cell disease. ? Gout. ? Injury of the spine. What are the signs or symptoms? Symptoms of this condition include:  Needing to pee right away (urgently).  Peeing often.  Peeing small amounts often.  Pain or burning when peeing.  Blood in the pee.  Pee that smells bad or not like normal.  Trouble peeing.  Pee that is cloudy.  Fluid coming from the vagina, if you are male.  Pain in the belly or lower back. Other symptoms include:  Throwing up (vomiting).  No urge to eat.  Feeling mixed up (confused).  Being tired  and grouchy (irritable).  A fever.  Watery poop (diarrhea). How is this treated? This condition may be treated with:  Antibiotic medicine.  Other medicines.  Drinking enough water. Follow these instructions at home:  Medicines  Take over-the-counter and prescription medicines only as told by your doctor.  If you were prescribed an antibiotic medicine, take it as told by your doctor. Do not stop taking it even if you start to feel better. General instructions  Make sure you: ? Pee until your bladder is empty. ? Do not hold pee for a long time. ? Empty your bladder after sex. ? Wipe from front to back after pooping if you are a male. Use each tissue one time when you wipe.  Drink enough fluid to keep your pee pale yellow.  Keep all follow-up visits as told by your doctor. This is important. Contact a doctor if:  You do not get better after 1-2 days.  Your symptoms go away and then come back. Get help right away if:  You have very bad back pain.  You have very bad pain in your lower belly.  You have a fever.  You are sick to your stomach (nauseous).  You are throwing up. Summary  A urinary tract infection (UTI) is an infection of any part of the urinary tract.  This condition is caused by germs in your genital area.  There are many risk factors for a UTI. These include having a small, thin   tube to drain pee and not being able to control when you pee or poop.  Treatment includes antibiotic medicines for germs.  Drink enough fluid to keep your pee pale yellow. This information is not intended to replace advice given to you by your health care provider. Make sure you discuss any questions you have with your health care provider. Document Revised: 07/15/2018 Document Reviewed: 02/04/2018 Elsevier Patient Education  2020 [employer] Acute Urinary Retention, Male  Acute urinary retention is a condition in which a person is unable to pass urine. This can last  for a short time or for a long time. If left untreated, it can result in kidney damage or other serious complications. What are the causes? This condition may be caused by:  Obstruction or narrowing of the tube that drains the bladder (urethra). This may be caused by surgery or problems with nearby organs, such as the prostate gland, which can press or squeeze the urethra.  Problems with the nerves in the bladder. These can be caused by diseases, such as multiple sclerosis, or by spinal cord injuries.  Certain medicines.  Tumors in the area of the pelvis, bladder, or urethra.  Diabetes.  Degenerative cognitive conditions such as delirium or dementia.  Bladder or urinary tract infection.  Constipation.  Blood in the urine (hematuria).  Injury to the bladder or urethra.  Psychological (psychogenic) conditions. Someone may hold his urine due to trauma or because he does not want to use the bathroom. What increases the risk? This condition is more likely to develop in older men. As men age, their prostate may become larger and may start pressing or squeezing on the bladder or the urethra. What are the signs or symptoms? Symptoms of this condition include:  Trouble urinating.  Pain in the lower abdomen. Symptoms usually come on slowly over a long period of time. How is this diagnosed? This condition is diagnosed based on a physical exam and a medical history. You may also have other tests, including:  An ultrasound of the bladder or kidneys or both.  Blood tests.  A urine analysis.  Additional tests may be needed such as an MRI, kidney, or bladder function tests. How is this treated? Treatment for this condition may include:  Medicines.  Placing a thin, sterile tube (catheter) into the bladder to drain urine out of the body. This is called an indwelling urinary catheter. After being inserted, the catheter is held in place with a small balloon that is filled with sterile  water. Urine drains from the catheter into a collection bag outside of the body.  Behavioral therapy.  Treatment for any underlying conditions.  If needed, you may be treated in the hospital for kidney function problems or to manage other complications. Follow these instructions at home:  Take over-the-counter and prescription medicines only as told by your health care provider. Avoid certain medicines, such as decongestants, antihistamines, and some prescription medicines. Do not take any medicine unless your health care provider has approved.  If you were given an indwelling urinary catheter, take care of it as told by your health care provider.  Drink enough fluid to keep your urine clear or pale yellow.  If you were prescribed an antibiotic, take it as told by your health care provider. Do not stop taking the antibiotic even if you start to feel better.  Do not use any products that contain nicotine or tobacco, such as cigarettes and e-cigarettes. If you need help quitting, ask your  health care provider.  Monitor any changes in your symptoms. Tell your health care provider about any changes.  If instructed, monitor your blood pressure at home. Report changes as told by your health care provider.  Keep all follow-up visits as told by your health care provider. This is important. Contact a health care provider if:  You have uncomfortable bladder contractions that you cannot control (spasms) or you leak urine with the spasms. Get help right away if:  You have chills or fever.  You have blood in your urine.  You have a catheter and: ? Your catheter stops draining urine. ? Your catheter falls out. Summary  Acute urinary retention is a condition in which a person is unable to pass urine. If left untreated, it can result in kidney damage or other serious complications.  The cause of this condition may include an enlarged prostate. As men age, their prostate gland may become  larger and may start pressing or squeezing on the bladder or the urethra.  Treatment for this condition may include medicines and placement of an indwelling urinary catheter.  Monitor any changes in your symptoms. Tell your health care provider about any changes. This information is not intended to replace advice given to you by your health care provider. Make sure you discuss any questions you have with your health care provider. Document Revised: 07/10/2017 Document Reviewed: 08/29/2016 Elsevier Patient Education  [place].

## 2019-12-01 ENCOUNTER — Other Ambulatory Visit: Payer: Self-pay | Admitting: Nurse Practitioner

## 2019-12-01 LAB — URINE CULTURE
MICRO NUMBER:: 10384847
SPECIMEN QUALITY:: ADEQUATE

## 2019-12-01 MED ORDER — NITROFURANTOIN MONOHYD MACRO 100 MG PO CAPS: 100.0000 mg | ORAL_CAPSULE | Freq: Two times a day (BID) | ORAL | 0 refills | Status: DC

## 2020-02-04 IMAGING — US US EXTREM LOW VENOUS*L*
1 series · 13 of 24 positions shown · non-contrast
Comparison: None.

CLINICAL DATA: Swelling and pain for 4 days, recent injury



[Series 1: us extrem low venous*left* · 0.05mm/px · 13 of 41 slices shown]
[im 1/41]
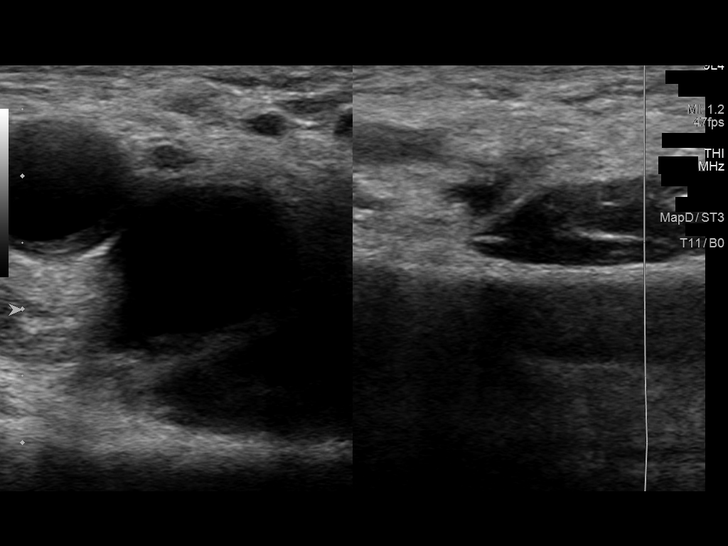
[im 4/41]
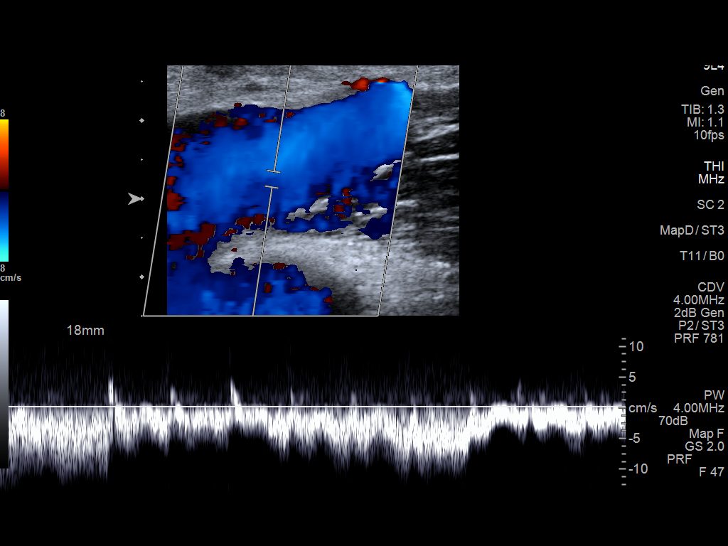
[im 7/41]
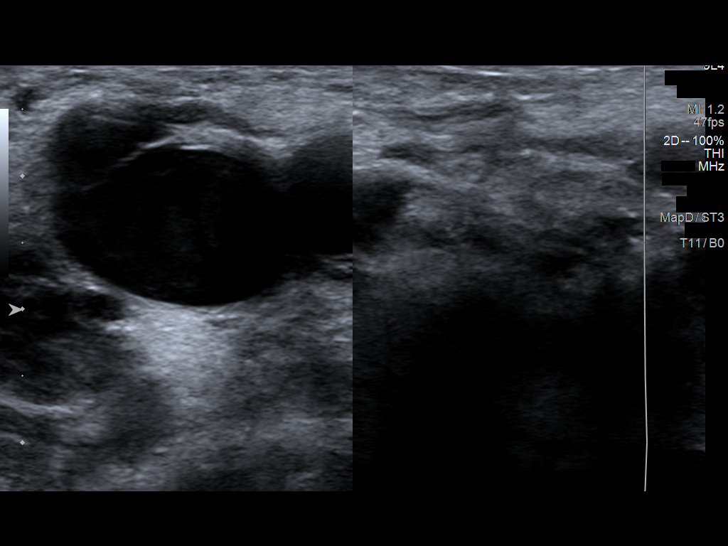
[im 11/41]
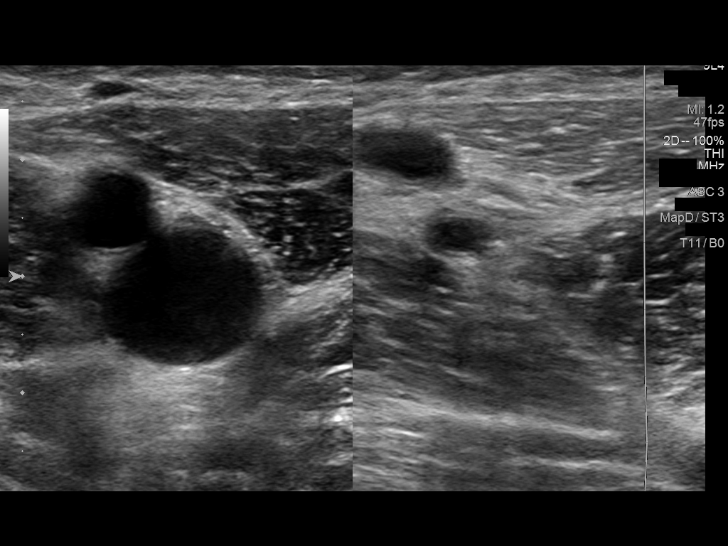
[im 14/41]
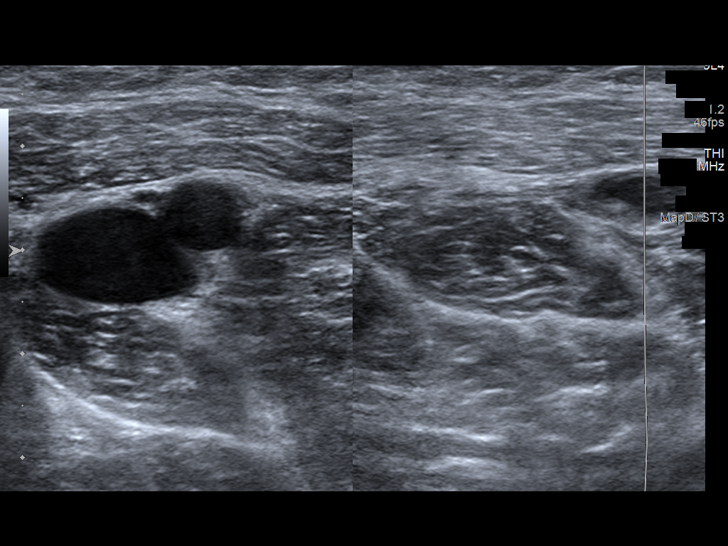
[im 18/41]
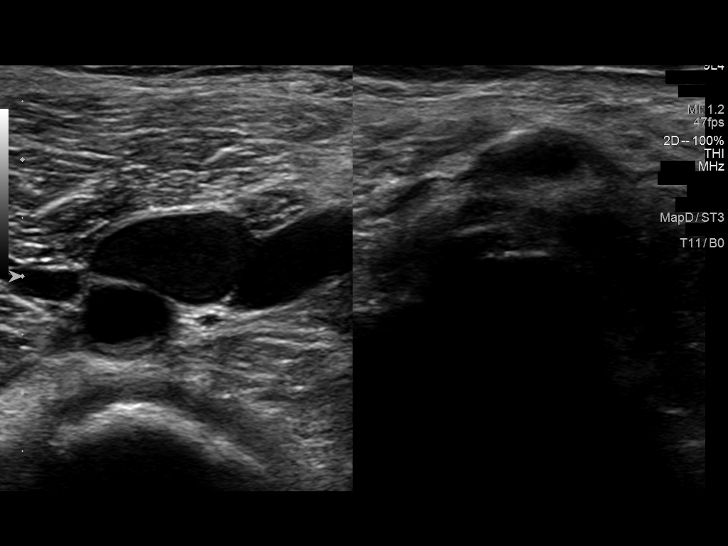
[im 21/41]
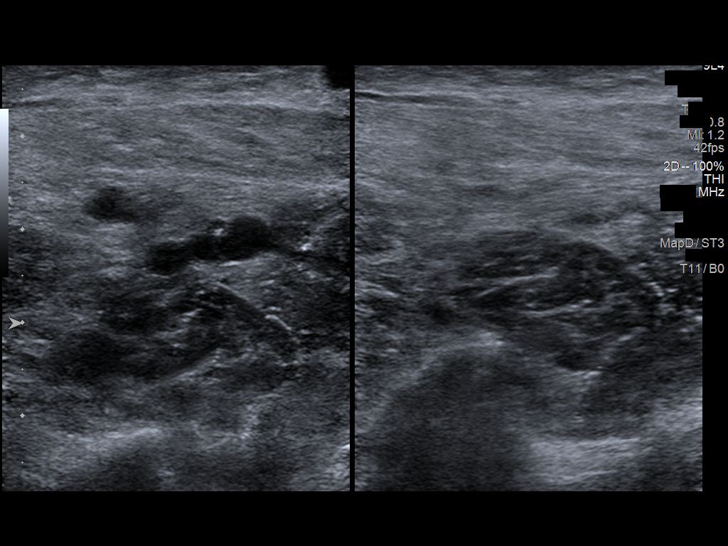
[im 23/41]
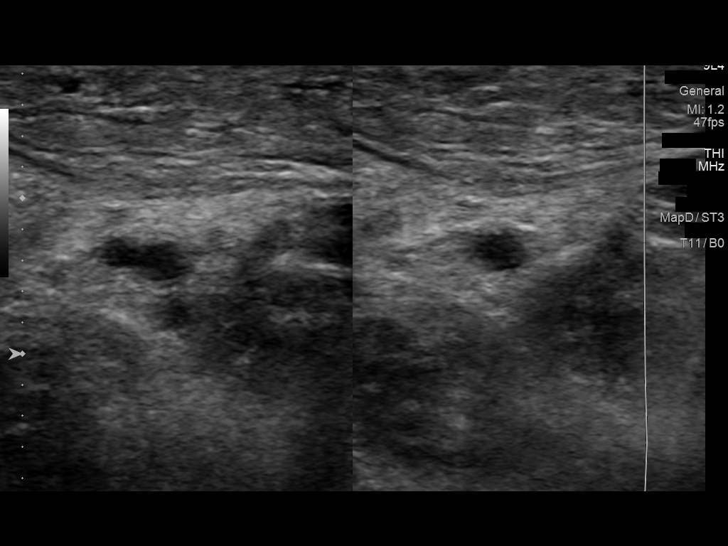
[im 27/41]
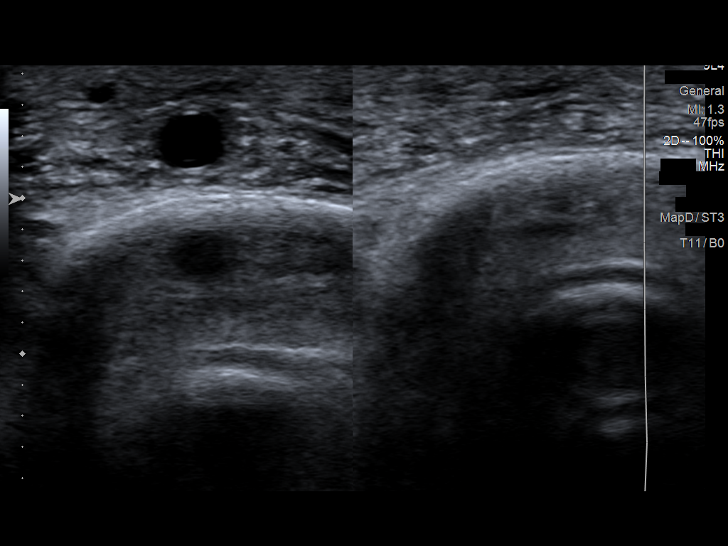
[im 30/41]
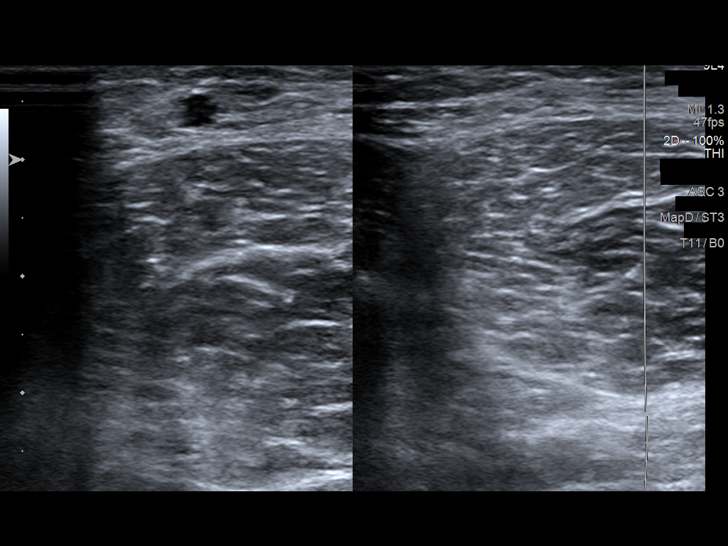
[im 34/41]
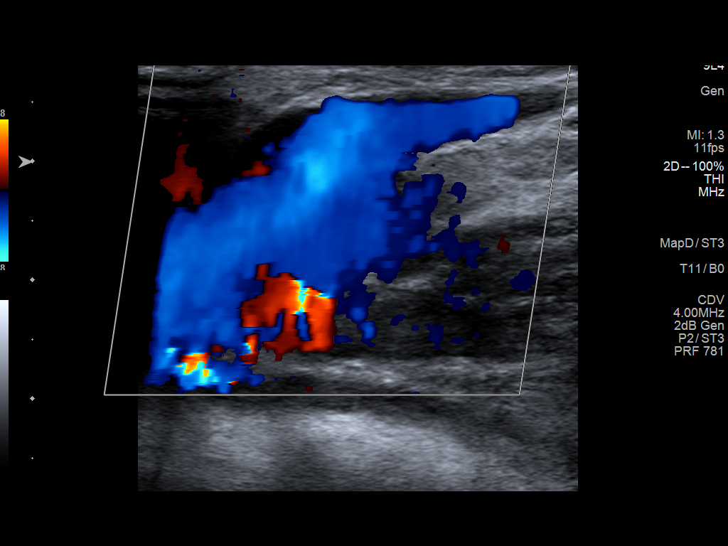
[im 37/41]
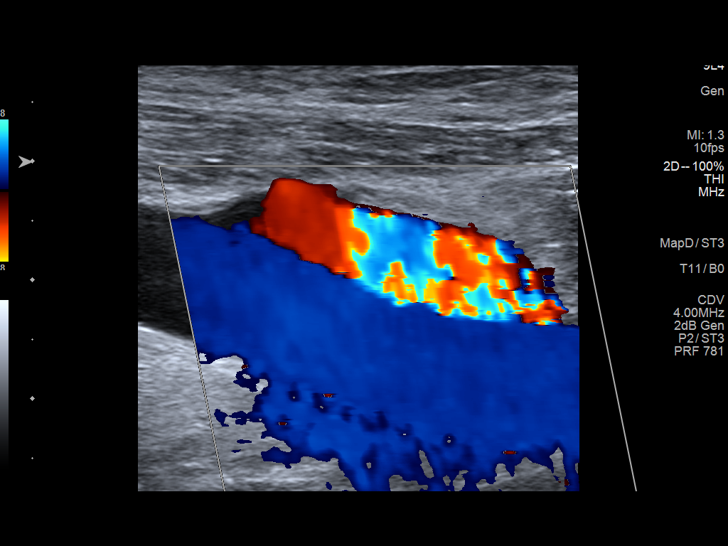
[im 41/41]
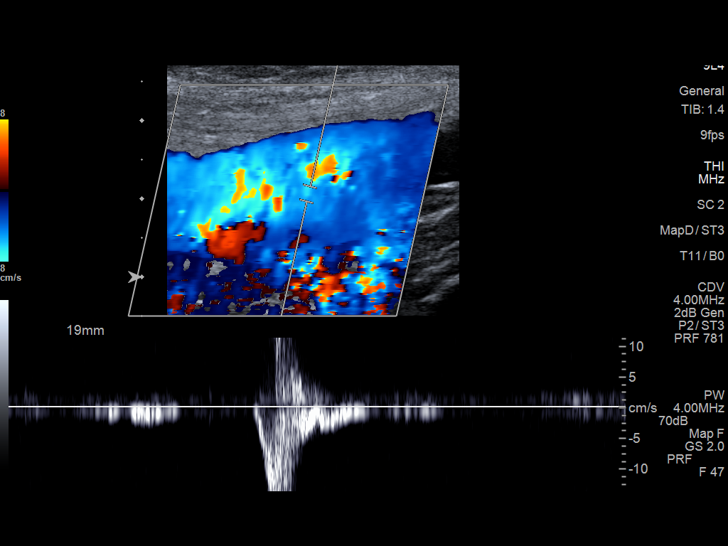

[13 of 24 positions shown; findings below may reference images not displayed]

FINDINGS: Contralateral Common Femoral Vein: Respiratory phasicity is normal
and symmetric with the symptomatic side. No evidence of thrombus.
Normal compressibility.

Common Femoral Vein: No evidence of thrombus. Normal
compressibility, respiratory phasicity and response to augmentation.

Saphenofemoral Junction: No evidence of thrombus. Normal
compressibility and flow on color Doppler imaging.

Profunda Femoral Vein: No evidence of thrombus. Normal
compressibility and flow on color Doppler imaging.

Femoral Vein: No evidence of thrombus. Normal compressibility,
respiratory phasicity and response to augmentation.

Popliteal Vein: No evidence of thrombus. Normal compressibility,
respiratory phasicity and response to augmentation.

Calf Veins: No evidence of thrombus. Normal compressibility and flow
on color Doppler imaging.

Superficial Great Saphenous Vein: No evidence of thrombus. Normal
compressibility.

Venous Reflux:  Not assessed

Other Findings:  None.
IMPRESSION: No evidence of deep venous thrombosis.

## 2020-04-09 ENCOUNTER — Ambulatory Visit (INDEPENDENT_AMBULATORY_CARE_PROVIDER_SITE_OTHER): Payer: BC Managed Care – PPO | Admitting: Osteopathic Medicine

## 2020-04-09 ENCOUNTER — Encounter: Payer: Self-pay | Admitting: Osteopathic Medicine

## 2020-04-09 ENCOUNTER — Other Ambulatory Visit: Payer: Self-pay

## 2020-04-09 VITALS — BP 116/66 | HR 95 | Wt 165.0 lb

## 2020-04-09 DIAGNOSIS — K4090 Unilateral inguinal hernia, without obstruction or gangrene, not specified as recurrent: Secondary | ICD-10-CM

## 2020-04-09 MED ORDER — OXYCODONE-ACETAMINOPHEN 5-325 MG PO TABS: 1.0000 | ORAL_TABLET | Freq: Three times a day (TID) | ORAL | 0 refills | Status: DC | PRN

## 2020-04-09 MED ORDER — OXYCODONE-ACETAMINOPHEN 5-325 MG PO TABS: 1.0000 | ORAL_TABLET | Freq: Three times a day (TID) | ORAL | 0 refills | Status: AC | PRN

## 2020-04-09 NOTE — Progress Notes (Signed)
Jordan Coffey is a 64 y.o. male who presents to  [facility] at [facility]  today, 04/09/20, seeking care for the following:  . Concern about groin pain, concerned about hernia. Reports "swelling" in R groin area on and off over past few months, worse over this weekend when he noted pain in the area w/ night sweats x1 night. No hip pain or difficulty walking, he notes injry few weeks ago stepping down hard on R leg after missing last step of ladder, no falls. On exam, no obvious inguinal/scrotal bulge, (+)tenderness to R groin over femoral vasculature but no bounding pulses or lymphadenopathy. Normal R hip exam.      ASSESSMENT & PLAN with other pertinent findings:  The encounter diagnosis was Non-recurrent unilateral inguinal hernia without obstruction or gangrene.   Inguinal hernia vs inguinal ligament pain - I cannot appreciate hernia at this time but pt reports bulging/sweling. Pain reproduced on exam. Pt is a runner, has had hip flexor issues before and this feels different. Will refer to gen surg for second opinion      There are no Patient Instructions on file for this visit.  Orders Placed This Encounter  Procedures  . Ambulatory referral to General Surgery    Meds ordered this encounter  Medications  . DISCONTD: oxyCODONE-acetaminophen (PERCOCET/ROXICET) 5-325 MG tablet    Sig: Take 1-2 tablets by mouth every 8 (eight) hours as needed for up to 5 days for severe pain.    Dispense:  15 tablet    Refill:  0  . oxyCODONE-acetaminophen (PERCOCET/ROXICET) 5-325 MG tablet    Sig: Take 1-2 tablets by mouth every 8 (eight) hours as needed for up to 5 days for severe pain.    Dispense:  15 tablet    Refill:  0       Follow-up instructions: Return if symptoms worsen or fail to improve.                                         BP 116/66 (BP Location: Left Arm, Patient Position: Sitting)   Pulse  95   Wt 165 lb (74.8 kg)   SpO2 95%   BMI 21.18 kg/m   Current Meds  Medication Sig  . atorvastatin (LIPITOR) 40 MG tablet Take 1 tablet (40 mg total) by mouth daily.  . cyclobenzaprine (FLEXERIL) 10 MG tablet Take 1 tablet (10 mg total) by mouth 3 (three) times daily as needed for muscle spasms.  Marland Kitchen ibuprofen (ADVIL,MOTRIN) 200 MG tablet Take 200 mg by mouth every 6 (six) hours as needed.  Marland Kitchen lisinopril-hydrochlorothiazide (ZESTORETIC) 20-12.5 MG tablet Take 1 tablet by mouth daily.  . Multiple Vitamins-Minerals (MULTIVITAMIN ADULT PO) Take by mouth.  . tamsulosin (FLOMAX) 0.4 MG CAPS capsule Take 1 capsule (0.4 mg total) by mouth daily.    No results found for this or any previous visit (from the past 72 hour(s)).  No results found.     All questions at time of visit were answered - patient instructed to contact office with any additional concerns or updates.  ER/RTC precautions were reviewed with the patient as applicable.   Please note: voice recognition software was used to produce this document, and typos may escape review. Please contact Dr. Lyn Hollingshead for any needed clarifications.

## 2020-07-02 ENCOUNTER — Ambulatory Visit (INDEPENDENT_AMBULATORY_CARE_PROVIDER_SITE_OTHER): Payer: BC Managed Care – PPO | Admitting: Osteopathic Medicine

## 2020-07-02 ENCOUNTER — Other Ambulatory Visit: Payer: Self-pay

## 2020-07-02 ENCOUNTER — Encounter: Payer: Self-pay | Admitting: Osteopathic Medicine

## 2020-07-02 VITALS — BP 107/79 | HR 96 | Temp 98.4°F | Wt 159.0 lb

## 2020-07-02 DIAGNOSIS — R8290 Unspecified abnormal findings in urine: Secondary | ICD-10-CM

## 2020-07-02 DIAGNOSIS — N390 Urinary tract infection, site not specified: Secondary | ICD-10-CM | POA: Diagnosis not present

## 2020-07-02 DIAGNOSIS — Z23 Encounter for immunization: Secondary | ICD-10-CM

## 2020-07-02 DIAGNOSIS — R3915 Urgency of urination: Secondary | ICD-10-CM

## 2020-07-02 LAB — POCT URINALYSIS DIP (CLINITEK)
Bilirubin, UA: NEGATIVE
Glucose, UA: NEGATIVE mg/dL
Ketones, POC UA: NEGATIVE mg/dL
Nitrite, UA: POSITIVE — AB
POC PROTEIN,UA: 30 — AB
Spec Grav, UA: 1.025 (ref 1.010–1.025)
Urobilinogen, UA: 0.2 E.U./dL
pH, UA: 7.5 (ref 5.0–8.0)

## 2020-07-02 MED ORDER — CIPROFLOXACIN HCL 500 MG PO TABS: 500.0000 mg | ORAL_TABLET | Freq: Two times a day (BID) | ORAL | 0 refills | Status: DC

## 2020-07-02 NOTE — Patient Instructions (Addendum)
Urine today not totally normal. Sending for further testing for infection and treating with antibiotics today. If worse, especially if severe abdominal or back pain, bloody urine, nausea or fever, please seek emergency medical attention!

## 2020-07-02 NOTE — Progress Notes (Signed)
Jordan Coffey is a 64 y.o. male who presents to  [facility] at [facility]  today, 07/02/20, seeking care for the following:   Urinary urgency and odor x3 days    Results for orders placed or performed in visit on 07/02/20 (from the past 48 hour(s))  Urinalysis, Routine w reflex microscopic     Status: Abnormal   Collection Time: 07/02/20  3:35 PM  Result Value Ref Range   Color, Urine DARK YELLOW YELLOW   APPearance CLOUDY (A) CLEAR   Specific Gravity, Urine 1.017 1.001 - 1.03   pH 7.5 5.0 - 8.0   Glucose, UA NEGATIVE NEGATIVE   Bilirubin Urine NEGATIVE NEGATIVE   Ketones, ur NEGATIVE NEGATIVE   Hgb urine dipstick NEGATIVE NEGATIVE   Protein, ur 1+ (A) NEGATIVE   Nitrite NEGATIVE NEGATIVE   Leukocytes,Ua 2+ (A) NEGATIVE   WBC, UA 6-10 (A) 0 - 5 /HPF   RBC / HPF 0-2 0 - 2 /HPF   Squamous Epithelial / LPF NONE SEEN < OR = 5 /HPF   Bacteria, UA NONE SEEN NONE SEEN /HPF   Hyaline Cast NONE SEEN NONE SEEN /LPF  C. trachomatis/N. gonorrhoeae RNA     Status: None   Collection Time: 07/02/20  3:48 PM  Result Value Ref Range   C. trachomatis RNA, TMA NOT DETECTED NOT DETECT   N. gonorrhoeae RNA, TMA NOT DETECTED NOT DETECT    Comment: The analytical performance characteristics of this assay, when used to test SurePath(TM) specimens have been determined by [employer]. The modifications have not been cleared or approved by the FDA. This assay has been validated pursuant to the CLIA regulations and is used for clinical purposes. . For additional information, please refer to https://education.quest[facility].com/faq/FAQ154 (This link is being provided for information/ educational purposes only.) .   POCT URINALYSIS DIP (CLINITEK)     Status: Abnormal   Collection Time: 07/02/20  3:57 PM  Result Value Ref Range   Color, UA yellow yellow   Clarity, UA cloudy (A) clear   Glucose, UA negative negative mg/dL   Bilirubin, UA  negative negative   Ketones, POC UA negative negative mg/dL   Spec Grav, UA 6.010 9.323 - 1.025   Blood, UA trace-intact (A) negative   pH, UA 7.5 5.0 - 8.0   POC PROTEIN,UA =30 (A) negative, trace   Urobilinogen, UA 0.2 0.2 or 1.0 E.U./dL   Nitrite, UA Positive (A) Negative   Leukocytes, UA Small (1+) (A) Negative        ASSESSMENT & PLAN with other pertinent findings:  The primary encounter diagnosis was Complicated UTI (urinary tract infection). Diagnoses of Need for influenza vaccination, Urgency of urination, and Abnormal urinalysis were also pertinent to this visit.   Cipro on back order, resent w/ Bactrim    Patient Instructions  Urine today not totally normal. Sending for further testing for infection and treating with antibiotics today. If worse, especially if severe abdominal or back pain, bloody urine, nausea or fever, please seek emergency medical attention!     Orders Placed This Encounter  Procedures   Urine Culture   C. trachomatis/N. gonorrhoeae RNA   MICROSCOPIC MESSAGE   Flu Vaccine QUAD 6+ mos PF IM (Fluarix Quad PF)   Urinalysis, Routine w reflex microscopic   POCT URINALYSIS DIP (CLINITEK)    Meds ordered this encounter  Medications   DISCONTD: ciprofloxacin (CIPRO) 500 MG tablet    Sig: Take 1 tablet (500 mg  total) by mouth 2 (two) times daily.    Dispense:  20 tablet    Refill:  0       Follow-up instructions: Return for RECHECK PENDING RESULTS / IF WORSE OR CHANGE.                                         BP (!) 145/105 (BP Location: Left Arm, Patient Position: Sitting, Cuff Size: Normal)    Pulse 91    Temp 98.4 F (36.9 C) (Oral)    Wt 159 lb 0.6 oz (72.1 kg)    BMI 20.42 kg/m   Current Meds  Medication Sig   atorvastatin (LIPITOR) 40 MG tablet Take 1 tablet (40 mg total) by mouth daily.   cyclobenzaprine (FLEXERIL) 10 MG tablet Take 1 tablet (10 mg total) by mouth 3 (three) times daily  as needed for muscle spasms.   ibuprofen (ADVIL,MOTRIN) 200 MG tablet Take 200 mg by mouth every 6 (six) hours as needed.   lisinopril-hydrochlorothiazide (ZESTORETIC) 20-12.5 MG tablet Take 1 tablet by mouth daily.   Multiple Vitamins-Minerals (MULTIVITAMIN ADULT PO) Take by mouth.   tamsulosin (FLOMAX) 0.4 MG CAPS capsule Take 1 capsule (0.4 mg total) by mouth daily.    No results found for this or any previous visit (from the past 72 hour(s)).  No results found.     All questions at time of visit were answered - patient instructed to contact office with any additional concerns or updates.  ER/RTC precautions were reviewed with the patient as applicable.   Please note: voice recognition software was used to produce this document, and typos may escape review. Please contact Dr. Lyn Hollingshead for any needed clarifications.

## 2020-07-03 ENCOUNTER — Telehealth: Payer: Self-pay

## 2020-07-03 LAB — C. TRACHOMATIS/N. GONORRHOEAE RNA
C. trachomatis RNA, TMA: NOT DETECTED
N. gonorrhoeae RNA, TMA: NOT DETECTED

## 2020-07-03 MED ORDER — SULFAMETHOXAZOLE-TRIMETHOPRIM 800-160 MG PO TABS: 1.0000 | ORAL_TABLET | Freq: Two times a day (BID) | ORAL | 0 refills | Status: DC

## 2020-07-03 NOTE — Telephone Encounter (Signed)
alernative sent  cipro taken off list

## 2020-07-03 NOTE — Telephone Encounter (Signed)
Per pharmacy -   Ciprofloxacin hcl 500 mg currently on back order. Pls send an alternative rx.

## 2020-07-04 LAB — URINALYSIS, ROUTINE W REFLEX MICROSCOPIC
Bacteria, UA: NONE SEEN /HPF
Bilirubin Urine: NEGATIVE
Glucose, UA: NEGATIVE
Hgb urine dipstick: NEGATIVE
Hyaline Cast: NONE SEEN /LPF
Ketones, ur: NEGATIVE
Nitrite: NEGATIVE
Specific Gravity, Urine: 1.017 (ref 1.001–1.03)
Squamous Epithelial / HPF: NONE SEEN /HPF (ref <=5)
pH: 7.5 (ref 5.0–8.0)

## 2020-07-04 LAB — URINE CULTURE
MICRO NUMBER:: 11236542
SPECIMEN QUALITY:: ADEQUATE

## 2020-07-04 LAB — TRICHOMONAS VAGINALIS RNA, QL,MALES: Trichomonas vaginalis RNA: NOT DETECTED

## 2020-07-04 NOTE — Addendum Note (Signed)
Addended by: Deirdre Pippins on: 07/04/2020 05:26 PM   Modules accepted: Orders

## 2020-09-10 ENCOUNTER — Encounter: Payer: BC Managed Care – PPO | Admitting: Osteopathic Medicine

## 2020-09-19 ENCOUNTER — Other Ambulatory Visit: Payer: Self-pay | Admitting: Osteopathic Medicine

## 2020-09-28 ENCOUNTER — Other Ambulatory Visit: Payer: Self-pay

## 2020-09-28 ENCOUNTER — Ambulatory Visit (INDEPENDENT_AMBULATORY_CARE_PROVIDER_SITE_OTHER): Payer: Medicare Other | Admitting: Osteopathic Medicine

## 2020-09-28 ENCOUNTER — Encounter: Payer: Self-pay | Admitting: Osteopathic Medicine

## 2020-09-28 VITALS — BP 112/72 | HR 83 | Temp 98.8°F | Resp 20 | Ht 74.0 in | Wt 159.0 lb

## 2020-09-28 DIAGNOSIS — Z0000 Encounter for general adult medical examination without abnormal findings: Secondary | ICD-10-CM

## 2020-09-28 DIAGNOSIS — R350 Frequency of micturition: Secondary | ICD-10-CM | POA: Diagnosis not present

## 2020-09-28 DIAGNOSIS — E782 Mixed hyperlipidemia: Secondary | ICD-10-CM | POA: Diagnosis not present

## 2020-09-28 DIAGNOSIS — N401 Enlarged prostate with lower urinary tract symptoms: Secondary | ICD-10-CM | POA: Diagnosis not present

## 2020-09-28 DIAGNOSIS — N390 Urinary tract infection, site not specified: Secondary | ICD-10-CM | POA: Diagnosis not present

## 2020-09-28 DIAGNOSIS — I10 Essential (primary) hypertension: Secondary | ICD-10-CM | POA: Diagnosis not present

## 2020-09-28 MED ORDER — TAMSULOSIN HCL 0.4 MG PO CAPS
0.4000 mg | ORAL_CAPSULE | Freq: Every day | ORAL | 3 refills | Status: DC
Start: 2020-09-28 — End: 2021-09-26

## 2020-09-28 MED ORDER — LISINOPRIL-HYDROCHLOROTHIAZIDE 20-12.5 MG PO TABS
1.0000 | ORAL_TABLET | Freq: Every day | ORAL | 3 refills | Status: DC
Start: 2020-09-28 — End: 2021-09-26

## 2020-09-28 MED ORDER — ATORVASTATIN CALCIUM 40 MG PO TABS
40.0000 mg | ORAL_TABLET | Freq: Every day | ORAL | 3 refills | Status: DC
Start: 2020-09-28 — End: 2021-09-26

## 2020-09-28 NOTE — Patient Instructions (Addendum)
General Preventive Care  Most recent routine screening labs: ordered today.   Blood pressure goal 130/80 or less.   Tobacco: don't! Please let me know if you need help quitting!  Alcohol: responsible moderation is ok for most adults - if you have concerns about your alcohol intake, please talk to me!   Exercise: as tolerated to reduce risk of cardiovascular disease and diabetes. Strength training will also prevent osteoporosis.   Mental health: if need for mental health care (medicines, counseling, other), or concerns about moods, please let me know!   Sexual / Reproductive health: if need for STD testing, or if concerns with libido/pain problems, please let me know!  Advanced Directive: Living Will and/or Healthcare Power of Attorney recommended for all adults, regardless of age or health.  Vaccines  Flu vaccine: for almost everyone, every fall.   Shingles vaccine: after age 60.   Pneumonia vaccines: after age 67.  Tetanus booster: every 10 years - due 2027  COVID vaccine: THANKS for getting your vaccine! :) Cancer screenings   Colon cancer screening: for everyone age 47-75. Due 09/2021 (can do another Cologuard or can do colonoscopy)    Prostate cancer screening: PSA blood test age 68-71. Ordered w/ labs.   Lung cancer screening: CT chest every year for those aged 77 to 80 years who have a 20 pack-year smoking history and currently smoke or have quit within the past 15 years  Infection screenings  . HIV: recommended screening at least once age 57-65 . Gonorrhea/Chlamydia: screening as needed . Hepatitis C: recommended once for everyone age 81-75 . TB: certain at-risk populations, or depending on work requirements and/or travel history Other . Bone Density Test: recommended at age 32 . Abdominal Aortic Aneurysm: screening with ultrasound recommended once for men age 73-75 who have ever smoked

## 2020-09-28 NOTE — Progress Notes (Signed)
Jordan Coffey is a 65 y.o. male who presents to  [facility] at [facility]  today, 09/28/20, seeking care for the following:  . Annual physical      ASSESSMENT & PLAN with other pertinent findings:  The primary encounter diagnosis was Annual physical exam. Diagnoses of Recurrent UTI, Benign prostatic hyperplasia with urinary frequency, Essential hypertension, and Mixed hyperlipidemia were also pertinent to this visit.   1. Annual physical exam See below Declines Shingrix #2 today but would like to get it next time  Counseled on tobacco cessation - cigars and chew, no cigarettes   2. Recurrent UTI 3. Benign prostatic hyperplasia with urinary frequency Continue meds Not interested in Urology referral at this time unless he has another UTI  4. Essential hypertension At goal  5. Mixed hyperlipidemia Labs pending   Patient Instructions  General Preventive Care  Most recent routine screening labs: ordered today.   Blood pressure goal 130/80 or less.   Tobacco: don't! Please let me know if you need help quitting!  Alcohol: responsible moderation is ok for most adults - if you have concerns about your alcohol intake, please talk to me!   Exercise: as tolerated to reduce risk of cardiovascular disease and diabetes. Strength training will also prevent osteoporosis.   Mental health: if need for mental health care (medicines, counseling, other), or concerns about moods, please let me know!   Sexual / Reproductive health: if need for STD testing, or if concerns with libido/pain problems, please let me know!  Advanced Directive: Living Will and/or Healthcare Power of Attorney recommended for all adults, regardless of age or health.  Vaccines  Flu vaccine: for almost everyone, every fall.   Shingles vaccine: after age 63.   Pneumonia vaccines: after age 79.  Tetanus booster: every 10 years - due 2027  COVID vaccine: THANKS for  getting your vaccine! :) Cancer screenings   Colon cancer screening: for everyone age 57-75. Due 09/2021 (can do another Cologuard or can do colonoscopy)    Prostate cancer screening: PSA blood test age 66-71. Ordered w/ labs.   Lung cancer screening: CT chest every year for those aged 69 to 80 years who have a 20 pack-year smoking history and currently smoke or have quit within the past 15 years  Infection screenings  . HIV: recommended screening at least once age 69-65 . Gonorrhea/Chlamydia: screening as needed . Hepatitis C: recommended once for everyone age 79-75 . TB: certain at-risk populations, or depending on work requirements and/or travel history Other . Bone Density Test: recommended at age 36 . Abdominal Aortic Aneurysm: screening with ultrasound recommended once for men age 62-75 who have ever smoked    Orders Placed This Encounter  Procedures  . CBC  . COMPLETE METABOLIC PANEL WITH GFR  . Lipid panel  . PSA, Total with Reflex to PSA, Free    Meds ordered this encounter  Medications  . tamsulosin (FLOMAX) 0.4 MG CAPS capsule    Sig: Take 1 capsule (0.4 mg total) by mouth daily.    Dispense:  90 capsule    Refill:  3  . lisinopril-hydrochlorothiazide (ZESTORETIC) 20-12.5 MG tablet    Sig: Take 1 tablet by mouth daily.    Dispense:  90 tablet    Refill:  3  . atorvastatin (LIPITOR) 40 MG tablet    Sig: Take 1 tablet (40 mg total) by mouth daily.    Dispense:  90 tablet    Refill:  3  See below for relevant physical exam findings  See below for recent lab and imaging results reviewed  Medications, allergies, PMH, PSH, SocH, FamH reviewed below    Follow-up instructions: Return in about 1 year (around 09/28/2021) for ANNUAL CHECK-UP - SEE [place] SOONER IF NEEDED.                                        Exam:  BP 112/72 (BP Location: Left Arm, Cuff Size: Normal)   Pulse 83   Temp 98.8 F (37.1 C) (Oral)   Resp 20   Ht  6\' 2"  (1.88 m)   Wt 159 lb (72.1 kg)   SpO2 100%   BMI 20.41 kg/m   Constitutional: VS see above. General Appearance: alert, well-developed, well-nourished, NAD  Neck: No masses, trachea midline.   Respiratory: Normal respiratory effort. no wheeze, no rhonchi, no rales  Cardiovascular: S1/S2 normal, no murmur, no rub/gallop auscultated. RRR.   Musculoskeletal: Gait normal. Symmetric and independent movement of all extremities  Abdominal: non-tender, non-distended, no appreciable organomegaly, neg Murphy's, BS WNLx4  Neurological: Normal balance/coordination. No tremor.  Skin: warm, dry, intact.   Psychiatric: Normal judgment/insight. Normal mood and affect. Oriented x3.   Current Meds  Medication Sig  . cyclobenzaprine (FLEXERIL) 10 MG tablet Take 1 tablet (10 mg total) by mouth 3 (three) times daily as needed for muscle spasms.  ibuprofen (ADVIL,MOTRIN) 200 MG tablet Take 200 mg by mouth every 6 (six) hours as needed.  . Multiple Vitamins-Minerals (MULTIVITAMIN ADULT PO) Take by mouth.  . [DISCONTINUED] atorvastatin (LIPITOR) 40 MG tablet TAKE 1 TABLET BY MOUTH EVERY DAY  . [DISCONTINUED] lisinopril-hydrochlorothiazide (ZESTORETIC) 20-12.5 MG tablet Take 1 tablet by mouth daily.  . [DISCONTINUED] tamsulosin (FLOMAX) 0.4 MG CAPS capsule Take 1 capsule (0.4 mg total) by mouth daily.    No Known Allergies  Patient Active Problem List   Diagnosis Date Noted  . HLD (hyperlipidemia) 06/15/2018  . Smoker 11/04/2016  . LVH (left ventricular hypertrophy) 05/01/2016  . Diastolic dysfunction 05/01/2016  . Shortened PR interval 04/22/2016  . BPH (benign prostatic hyperplasia) 04/21/2016  . HTN (hypertension) 04/21/2016    Family History  Problem Relation Age of Onset  . Diabetes Mother   . Hypertension Mother   . Hypertension Father     Social History   Tobacco Use  Smoking Status Current Some Day Smoker  . Types: Cigars  Smokeless Tobacco Current User    Past  Surgical History:  Procedure Laterality Date  . Arm surgery    . arthroscopic knee surgery      Immunization History  Administered Date(s) Administered  . Influenza,inj,Quad PF,6+ Mos 04/21/2016, 06/24/2017, 06/15/2018, 04/19/2019, 07/02/2020  . PFIZER(Purple Top)SARS-COV-2 Vaccination 10/25/2019, 11/15/2019, 05/11/2020  . Tdap 04/21/2016  . Zoster Recombinat (Shingrix) 08/29/2019    Recent Results (from the past 2160 hour(s))  Urinalysis, Routine w reflex microscopic     Status: Abnormal   Collection Time: 07/02/20  3:35 PM  Result Value Ref Range   Color, Urine DARK YELLOW YELLOW   APPearance CLOUDY (A) CLEAR   Specific Gravity, Urine 1.017 1.001 - 1.03   pH 7.5 5.0 - 8.0   Glucose, UA NEGATIVE NEGATIVE   Bilirubin Urine NEGATIVE NEGATIVE   Ketones, ur NEGATIVE NEGATIVE   Hgb urine dipstick NEGATIVE NEGATIVE   Protein, ur 1+ (A) NEGATIVE   Nitrite NEGATIVE NEGATIVE   Leukocytes,Ua 2+ (  A) NEGATIVE   WBC, UA 6-10 (A) 0 - 5 /HPF   RBC / HPF 0-2 0 - 2 /HPF   Squamous Epithelial / LPF NONE SEEN < OR = 5 /HPF   Bacteria, UA NONE SEEN NONE SEEN /HPF   Hyaline Cast NONE SEEN NONE SEEN /LPF  Urine Culture     Status: Abnormal   Collection Time: 07/02/20  3:35 PM   Specimen: Urine  Result Value Ref Range   MICRO NUMBER: [id number]    SPECIMEN QUALITY: Adequate    Sample Source NOT GIVEN    STATUS: FINAL    ISOLATE 1: Escherichia coli (A)     Comment: Greater than 100,000 CFU/mL of Escherichia coli      Susceptibility   Escherichia coli - URINE CULTURE, REFLEX    AMOX/CLAVULANIC 4 Sensitive     AMPICILLIN 8 Sensitive     AMPICILLIN/SULBACTAM 4 Sensitive     CEFAZOLIN* <=4 Not Reportable      * For infections other than uncomplicated UTIcaused by E. coli, K. pneumoniae or P. mirabilis:Cefazolin is resistant if MIC > or = 8 mcg/mL.(Distinguishing susceptible versus intermediatefor isolates with MIC < or = 4 mcg/mL requiresadditional testing.)For uncomplicated UTI caused by E.  coli,K. pneumoniae or P. mirabilis: Cefazolin issusceptible if MIC <32 mcg/mL and predictssusceptible to the oral agents cefaclor, cefdinir,cefpodoxime, cefprozil, cefuroxime, cephalexinand loracarbef.    CEFEPIME <=1 Sensitive     CEFTRIAXONE <=1 Sensitive     CIPROFLOXACIN >=4 Resistant     LEVOFLOXACIN >=8 Resistant     ERTAPENEM <=0.5 Sensitive     GENTAMICIN <=1 Sensitive     IMIPENEM <=0.25 Sensitive     NITROFURANTOIN <=16 Sensitive     PIP/TAZO <=4 Sensitive     TOBRAMYCIN <=1 Sensitive     TRIMETH/SULFA* <=20 Sensitive      * For infections other than uncomplicated UTIcaused by E. coli, K. pneumoniae or P. mirabilis:Cefazolin is resistant if MIC > or = 8 mcg/mL.(Distinguishing susceptible versus intermediatefor isolates with MIC < or = 4 mcg/mL requiresadditional testing.)For uncomplicated UTI caused by E. coli,K. pneumoniae or P. mirabilis: Cefazolin issusceptible if MIC <32 mcg/mL and predictssusceptible to the oral agents cefaclor, cefdinir,cefpodoxime, cefprozil, cefuroxime, cephalexinand loracarbef.Legend:S = Susceptible  I = IntermediateR = Resistant  NS = Not susceptible* = Not tested  NR = Not reported**NN = See antimicrobic comments  Trichomonas vaginalis RNA, Ql,Males     Status: None   Collection Time: 07/02/20  3:48 PM  Result Value Ref Range   Trichomonas vaginalis RNA NOT DETECTED NOT DETECT    Comment: The analytical performance characteristics of this assay have been determined by [employer]. The modifications have not been cleared or approved by the FDA. This assay has been validated pursuant to the CLIA regulations and is used for clinical purposes. . For additional information, please refer to http://education.[facility].com/ faq/Trichomonastma (This link is being provided for informational/ educational purposes only.) .   C. trachomatis/N. gonorrhoeae RNA     Status: None   Collection Time: 07/02/20  3:48 PM  Result Value Ref Range   C.  trachomatis RNA, TMA NOT DETECTED NOT DETECT   N. gonorrhoeae RNA, TMA NOT DETECTED NOT DETECT    Comment: The analytical performance characteristics of this assay, when used to test SurePath(TM) specimens have been determined by [employer]. The modifications have not been cleared or approved by the FDA. This assay has been validated pursuant to the CLIA regulations and is used for clinical purposes. .Marland Kitchen  For additional information, please refer to https://education.[facility].com/faq/FAQ154 (This link is being provided for information/ educational purposes only.) .   POCT URINALYSIS DIP (CLINITEK)     Status: Abnormal   Collection Time: 07/02/20  3:57 PM  Result Value Ref Range   Color, UA yellow yellow   Clarity, UA cloudy (A) clear   Glucose, UA negative negative mg/dL   Bilirubin, UA negative negative   Ketones, POC UA negative negative mg/dL   Spec Grav, UA 3.875 6.433 - 1.025   Blood, UA trace-intact (A) negative   pH, UA 7.5 5.0 - 8.0   POC PROTEIN,UA =30 (A) negative, trace   Urobilinogen, UA 0.2 0.2 or 1.0 E.U./dL   Nitrite, UA Positive (A) Negative   Leukocytes, UA Small (1+) (A) Negative    No results found.     All questions at time of visit were answered - patient instructed to contact office with any additional concerns or updates. ER/RTC precautions were reviewed with the patient as applicable.   Please note: manual typing as well as voice recognition software may have been used to produce this document - typos may escape review. Please contact Dr. Lyn Hollingshead for any needed clarifications.

## 2020-10-01 LAB — LIPID PANEL
Cholesterol: 111 mg/dL (ref <200)
HDL: 65 mg/dL (ref >=40)
LDL Cholesterol (Calc): 28 mg/dL (calc)
Non-HDL Cholesterol (Calc): 46 mg/dL (calc) (ref <130)
Total CHOL/HDL Ratio: 1.7 (calc) (ref <5.0)
Triglycerides: 94 mg/dL (ref <150)

## 2020-10-01 LAB — CBC
HCT: 40.2 % (ref 38.5–50.0)
Hemoglobin: 13.7 g/dL (ref 13.2–17.1)
MCH: 31.8 pg (ref 27.0–33.0)
MCHC: 34.1 g/dL (ref 32.0–36.0)
MCV: 93.3 fL (ref 80.0–100.0)
MPV: 10.4 fL (ref 7.5–12.5)
Platelets: 199 10*3/uL (ref 140–400)
RBC: 4.31 10*6/uL (ref 4.20–5.80)
RDW: 13.2 % (ref 11.0–15.0)
WBC: 9.1 10*3/uL (ref 3.8–10.8)

## 2020-10-01 LAB — COMPLETE METABOLIC PANEL WITH GFR
AG Ratio: 1.3 (calc) (ref 1.0–2.5)
ALT: 17 U/L (ref 9–46)
AST: 22 U/L (ref 10–35)
Albumin: 4 g/dL (ref 3.6–5.1)
Alkaline phosphatase (APISO): 63 U/L (ref 35–144)
BUN: 13 mg/dL (ref 7–25)
CO2: 30 mmol/L (ref 20–32)
Calcium: 9.4 mg/dL (ref 8.6–10.3)
Chloride: 105 mmol/L (ref 98–110)
Creat: 1.18 mg/dL (ref 0.70–1.25)
GFR, Est African American: 76 mL/min/{1.73_m2} (ref >=60)
GFR, Est Non African American: 66 mL/min/{1.73_m2} (ref >=60)
Globulin: 3.2 g/dL (calc) (ref 1.9–3.7)
Glucose, Bld: 72 mg/dL (ref 65–139)
Potassium: 4.2 mmol/L (ref 3.5–5.3)
Sodium: 142 mmol/L (ref 135–146)
Total Bilirubin: 0.4 mg/dL (ref 0.2–1.2)
Total Protein: 7.2 g/dL (ref 6.1–8.1)

## 2020-10-01 LAB — PSA, TOTAL WITH REFLEX TO PSA, FREE: PSA, Total: 2.6 ng/mL (ref <=4.0)

## 2020-10-22 ENCOUNTER — Encounter: Payer: Self-pay | Admitting: Physician Assistant

## 2020-10-22 ENCOUNTER — Telehealth: Payer: Medicare Other | Admitting: Physician Assistant

## 2020-10-22 DIAGNOSIS — R220 Localized swelling, mass and lump, head: Secondary | ICD-10-CM

## 2020-10-22 MED ORDER — PREDNISONE 10 MG PO TABS: ORAL_TABLET | ORAL | 0 refills | Status: AC

## 2020-10-22 NOTE — Progress Notes (Signed)
[name], Jordan Coffey are scheduled for a virtual visit with your provider today.    Just as we do with appointments in the office, we must obtain your consent to participate.  Your consent will be active for this visit and any virtual visit you may have with one of our providers in the next 365 days.    If you have a MyChart account, I can also send a copy of this consent to you electronically.  All virtual visits are billed to your insurance company just like a traditional visit in the office.  As this is a virtual visit, video technology does not allow for your provider to perform a traditional examination.  This may limit your provider's ability to fully assess your condition.  If your provider identifies any concerns that need to be evaluated in person or the need to arrange testing such as labs, EKG, etc, we will make arrangements to do so.    Although advances in technology are sophisticated, we cannot ensure that it will always work on either your end or our end.  If the connection with a video visit is poor, we may have to switch to a telephone visit.  With either a video or telephone visit, we are not always able to ensure that we have a secure connection.   I need to obtain your verbal consent now.   Are you willing to proceed with your visit today?   Jordan Coffey has provided verbal consent on 10/22/2020 for a virtual visit (video or telephone).  Jordan Climes, PA-C 10/22/2020  1:01 PM   Virtual Visit via Video   I connected with patient on 10/22/20 at  1:00 PM EDT by a video enabled telemedicine application and verified that I am speaking with the correct person using two identifiers.  Location patient: Home Location provider: [facility] Persons participating in the virtual visit: Patient, Provider  I discussed the limitations of evaluation and management by telemedicine and the availability of in person appointments. The patient expressed understanding and agreed  to proceed.  Subjective:   HPI:   Patient presents via [facility] today c/o swelling of his lips first noted this morning when waking up. Notes he felt fine last night when he went to bed.  States he slept on the side of his face where the lip is are swollen.  No swelling is left-sided only.  Denies any pain.  Initially some mild itching but this is resolved.  Denies fever, chills, rash, shortness of breath or difficulty swallowing.  Denies any change to soaps, lotions or other hygiene products applied to the face.  Notes that he was out of town and ate shrimp and steak. Denies reaction to these before. Unsure if there was a spice. Happened a while ago when he ate at a steakhouse but in the meantime has not had a reaction despite eating similar foods. Had a head cold and has been taking Alka-seltzer Plus and some sudafed but has never had an issue with these medications. Mucinex.  Is on combination ACEI-thiazide. Has been on for years without issue.    ROS:   See pertinent positives and negatives per HPI.  Patient Active Problem List   Diagnosis Date Noted  . HLD (hyperlipidemia) 06/15/2018  . Smoker 11/04/2016  . LVH (left ventricular hypertrophy) 05/01/2016  . Diastolic dysfunction 05/01/2016  . Shortened PR interval 04/22/2016  . BPH (benign prostatic hyperplasia) 04/21/2016  . HTN (hypertension) 04/21/2016    Social History  Tobacco Use  . Smoking status: Current Some Day Smoker    Types: Cigars  . Smokeless tobacco: Current User  Substance Use Topics  . Alcohol use: Yes    Comment: Occasional    Current Outpatient Medications:  .  atorvastatin (LIPITOR) 40 MG tablet, Take 1 tablet (40 mg total) by mouth daily., Disp: 90 tablet, Rfl: 3 .  cyclobenzaprine (FLEXERIL) 10 MG tablet, Take 1 tablet (10 mg total) by mouth 3 (three) times daily as needed for muscle spasms., Disp: 30 tablet, Rfl: 1 .  ibuprofen (ADVIL,MOTRIN) 200 MG tablet, Take 200 mg by mouth every 6 (six) hours as  needed., Disp: , Rfl:  .  lisinopril-hydrochlorothiazide (ZESTORETIC) 20-12.5 MG tablet, Take 1 tablet by mouth daily., Disp: 90 tablet, Rfl: 3 .  Multiple Vitamins-Minerals (MULTIVITAMIN ADULT PO), Take by mouth., Disp: , Rfl:  .  tamsulosin (FLOMAX) 0.4 MG CAPS capsule, Take 1 capsule (0.4 mg total) by mouth daily., Disp: 90 capsule, Rfl: 3  No Known Allergies  Objective:   There were no vitals taken for this visit.  Patient is well-developed, well-nourished in no acute distress.  Resting comfortably at home.  Head is normocephalic, atraumatic.  No labored breathing.  Speech is clear and coherent with logical content.  Patient is alert and oriented at baseline.  Swelling of Left lower lips and surrounding soft tissue/skin. No lesions noted.   Assessment and Plan:   1. Lip swelling First noted this morning.  Very concerning for allergic reaction.  No alarm signs or symptoms present.  Is on ACE inhibitor but has been on this for many years without issue.  No recent dose change.  Could potentially be a food allergen giving he did just have shellfish.  Discussed with him that allergies can sometimes develop over time and just because he has not had an issue with shellfish before, does not mean he cannot develop an allergy.  Will start a prednisone taper to calm down his symptoms.  Recommend OTC antihistamine and cold compresses.  He is to reach out to his primary care provider for follow-up and to consider being set up with an allergist for further evaluation and management.  He is to avoid shellfish or red meat for the time being.  He was also asked to cut back on use of his OTC medications in case 1 of these are the culprit until he has had follow-up with his primary care provider.  Strict ER urgent care/ER precautions reviewed with patient who voiced understanding and agreement with the plan. - predniSONE (DELTASONE) 10 MG tablet; Take 4 tablets (40 mg total) by mouth daily with breakfast for  2 days, THEN 3 tablets (30 mg total) daily with breakfast for 2 days, THEN 2 tablets (20 mg total) daily with breakfast for 2 days, THEN 1 tablet (10 mg total) daily with breakfast for 2 days.  Dispense: 20 tablet; Refill: 0    Jordan Coffey, [place] 10/22/2020

## 2020-10-22 NOTE — Patient Instructions (Signed)
Instructions sent to patients MyChart.

## 2020-11-09 DIAGNOSIS — M545 Low back pain, unspecified: Secondary | ICD-10-CM | POA: Diagnosis not present

## 2020-11-26 ENCOUNTER — Telehealth: Payer: Self-pay | Admitting: General Practice

## 2020-11-26 ENCOUNTER — Telehealth: Payer: Self-pay

## 2020-11-26 NOTE — Telephone Encounter (Signed)
Transition Care Management Unsuccessful Follow-up Telephone Call  Date of discharge and from where:  [facility] 11/26/20  Attempts:  1st Attempt  Reason for unsuccessful TCM follow-up call:  Unable to leave message

## 2020-11-26 NOTE — Telephone Encounter (Signed)
Pt's wife called requesting medical records for patient. She stated that patient is currently staying at a hospital. Thanks in advance.

## 2020-11-27 ENCOUNTER — Telehealth: Payer: Self-pay | Admitting: Osteopathic Medicine

## 2020-11-27 NOTE — Telephone Encounter (Signed)
Transition Care Management Follow-up Telephone Call  Date of discharge and from where: 11/26/2020  How have you been since you were released from the hospital? Pt is currently admitted into [facility] hospital at [facility]

## 2020-11-27 NOTE — Telephone Encounter (Signed)
Dom, [place], I reviewed this patient's chart.  All I can tell is there was an ER encounter but there are no notes done so I have absolutely no idea what happened with this patient or what the current status is. OK to call wife and let her know.    Saw a PA: Paulino Rily [facility] - [facility] ER OBS UNIT  [address]  [place], [place] [address]  [phone]

## 2020-11-28 ENCOUNTER — Inpatient Hospital Stay: Payer: Medicare Other | Admitting: Osteopathic Medicine

## 2020-11-28 DIAGNOSIS — Z87440 Personal history of urinary (tract) infections: Secondary | ICD-10-CM | POA: Diagnosis not present

## 2020-11-28 DIAGNOSIS — Z638 Other specified problems related to primary support group: Secondary | ICD-10-CM | POA: Diagnosis not present

## 2020-11-28 DIAGNOSIS — N390 Urinary tract infection, site not specified: Secondary | ICD-10-CM | POA: Diagnosis not present

## 2020-11-28 DIAGNOSIS — Z87438 Personal history of other diseases of male genital organs: Secondary | ICD-10-CM | POA: Diagnosis not present

## 2020-11-28 DIAGNOSIS — B9620 Unspecified Escherichia coli [E. coli] as the cause of diseases classified elsewhere: Secondary | ICD-10-CM | POA: Diagnosis not present

## 2020-11-28 DIAGNOSIS — N400 Enlarged prostate without lower urinary tract symptoms: Secondary | ICD-10-CM | POA: Diagnosis not present

## 2020-11-28 DIAGNOSIS — I10 Essential (primary) hypertension: Secondary | ICD-10-CM | POA: Diagnosis not present

## 2020-11-28 DIAGNOSIS — Z79899 Other long term (current) drug therapy: Secondary | ICD-10-CM | POA: Diagnosis not present

## 2020-11-28 DIAGNOSIS — E785 Hyperlipidemia, unspecified: Secondary | ICD-10-CM | POA: Diagnosis not present

## 2020-11-28 DIAGNOSIS — Z20822 Contact with and (suspected) exposure to covid-19: Secondary | ICD-10-CM | POA: Diagnosis not present

## 2020-11-28 DIAGNOSIS — F29 Unspecified psychosis not due to a substance or known physiological condition: Secondary | ICD-10-CM | POA: Diagnosis not present

## 2020-11-28 NOTE — Telephone Encounter (Signed)
Wife had me schedule him anyways, she said if he can't come in she'll let us know to cancel.

## 2020-12-12 ENCOUNTER — Telehealth: Payer: Self-pay | Admitting: General Practice

## 2020-12-12 NOTE — Telephone Encounter (Signed)
Transition Care Management Unsuccessful Follow-up Telephone Call  Date of discharge and from where:  [facility] 12/11/20  Attempts:  1st Attempt  Reason for unsuccessful TCM follow-up call:  Left voice message

## 2020-12-13 NOTE — Telephone Encounter (Signed)
Transition Care Management Unsuccessful Follow-up Telephone Call  Date of discharge and from where:  [facility] 12/11/20  Attempts:  2nd Attempt  Reason for unsuccessful TCM follow-up call:  Left voice message Patient does have a office visit scheduled for 12/21/20

## 2020-12-14 NOTE — Telephone Encounter (Signed)
Transition Care Management Unsuccessful Follow-up Telephone Call  Date of discharge and from where:  [facility] 12/11/20  Attempts:  3rd Attempt  Reason for unsuccessful TCM follow-up call:  Left voice message Patient has OV scheduled for 12/21/20.

## 2020-12-19 ENCOUNTER — Emergency Department (HOSPITAL_COMMUNITY)
Admission: EM | Admit: 2020-12-19 | Discharge: 2020-12-21 | Disposition: A | Discharge status: Home / Self Care | Payer: Medicare Other | Attending: Emergency Medicine | Admitting: Emergency Medicine

## 2020-12-19 ENCOUNTER — Encounter (HOSPITAL_COMMUNITY): Payer: Self-pay | Admitting: Emergency Medicine

## 2020-12-19 DIAGNOSIS — R4182 Altered mental status, unspecified: Secondary | ICD-10-CM | POA: Diagnosis not present

## 2020-12-19 DIAGNOSIS — I10 Essential (primary) hypertension: Secondary | ICD-10-CM | POA: Insufficient documentation

## 2020-12-19 DIAGNOSIS — Y900 Blood alcohol level of less than 20 mg/100 ml: Secondary | ICD-10-CM | POA: Diagnosis not present

## 2020-12-19 DIAGNOSIS — F309 Manic episode, unspecified: Secondary | ICD-10-CM | POA: Insufficient documentation

## 2020-12-19 DIAGNOSIS — F3010 Manic episode without psychotic symptoms, unspecified: Secondary | ICD-10-CM | POA: Diagnosis present

## 2020-12-19 DIAGNOSIS — F17290 Nicotine dependence, other tobacco product, uncomplicated: Secondary | ICD-10-CM | POA: Insufficient documentation

## 2020-12-19 DIAGNOSIS — Z20822 Contact with and (suspected) exposure to covid-19: Secondary | ICD-10-CM | POA: Diagnosis not present

## 2020-12-19 DIAGNOSIS — Z79899 Other long term (current) drug therapy: Secondary | ICD-10-CM | POA: Diagnosis not present

## 2020-12-19 DIAGNOSIS — N390 Urinary tract infection, site not specified: Secondary | ICD-10-CM | POA: Diagnosis not present

## 2020-12-19 DIAGNOSIS — E785 Hyperlipidemia, unspecified: Secondary | ICD-10-CM | POA: Diagnosis present

## 2020-12-19 LAB — CBC WITH DIFFERENTIAL/PLATELET
Abs Immature Granulocytes: 0.02 10*3/uL (ref 0.00–0.07)
Basophils Absolute: 0 10*3/uL (ref 0.0–0.1)
Basophils Relative: 0 %
Eosinophils Absolute: 0 10*3/uL (ref 0.0–0.5)
Eosinophils Relative: 0 %
HCT: 27.3 % — ABNORMAL LOW (ref 39.0–52.0)
Hemoglobin: 9.1 g/dL — ABNORMAL LOW (ref 13.0–17.0)
Immature Granulocytes: 0 %
Lymphocytes Relative: 17 %
Lymphs Abs: 1.4 10*3/uL (ref 0.7–4.0)
MCH: 31.4 pg (ref 26.0–34.0)
MCHC: 33.3 g/dL (ref 30.0–36.0)
MCV: 94.1 fL (ref 80.0–100.0)
Monocytes Absolute: 0.4 10*3/uL (ref 0.1–1.0)
Monocytes Relative: 5 %
Neutro Abs: 6.3 10*3/uL (ref 1.7–7.7)
Neutrophils Relative %: 78 %
Platelets: 200 10*3/uL (ref 150–400)
RBC: 2.9 MIL/uL — ABNORMAL LOW (ref 4.22–5.81)
RDW: 14.6 % (ref 11.5–15.5)
WBC: 8.1 10*3/uL (ref 4.0–10.5)
nRBC: 0 % (ref 0.0–0.2)

## 2020-12-19 LAB — RESP PANEL BY RT-PCR (FLU A&B, COVID) ARPGX2
Influenza A by PCR: NEGATIVE
Influenza B by PCR: NEGATIVE
SARS Coronavirus 2 by RT PCR: NEGATIVE

## 2020-12-19 LAB — ETHANOL: Alcohol, Ethyl (B): <10 mg/dL (ref <10)

## 2020-12-19 LAB — COMPREHENSIVE METABOLIC PANEL
ALT: 25 U/L (ref 0–44)
AST: 25 U/L (ref 15–41)
Albumin: 3.1 g/dL — ABNORMAL LOW (ref 3.5–5.0)
Alkaline Phosphatase: 35 U/L — ABNORMAL LOW (ref 38–126)
Anion gap: 4 — ABNORMAL LOW (ref 5–15)
BUN: 19 mg/dL (ref 8–23)
CO2: 28 mmol/L (ref 22–32)
Calcium: 8.9 mg/dL (ref 8.9–10.3)
Chloride: 105 mmol/L (ref 98–111)
Creatinine, Ser: 0.9 mg/dL (ref 0.61–1.24)
GFR, Estimated: >60 mL/min (ref >60)
Glucose, Bld: 88 mg/dL (ref 70–99)
Potassium: 4.4 mmol/L (ref 3.5–5.1)
Sodium: 137 mmol/L (ref 135–145)
Total Bilirubin: 0.2 mg/dL — ABNORMAL LOW (ref 0.3–1.2)
Total Protein: 6.2 g/dL — ABNORMAL LOW (ref 6.5–8.1)

## 2020-12-19 LAB — ACETAMINOPHEN LEVEL: Acetaminophen (Tylenol), Serum: <10 ug/mL — ABNORMAL LOW (ref 10–30)

## 2020-12-19 LAB — CBC
HCT: 28.5 % — ABNORMAL LOW (ref 39.0–52.0)
Hemoglobin: 9.6 g/dL — ABNORMAL LOW (ref 13.0–17.0)
MCH: 31.2 pg (ref 26.0–34.0)
MCHC: 33.7 g/dL (ref 30.0–36.0)
MCV: 92.5 fL (ref 80.0–100.0)
Platelets: 223 10*3/uL (ref 150–400)
RBC: 3.08 MIL/uL — ABNORMAL LOW (ref 4.22–5.81)
RDW: 14.2 % (ref 11.5–15.5)
WBC: 9.6 10*3/uL (ref 4.0–10.5)
nRBC: 0 % (ref 0.0–0.2)

## 2020-12-19 LAB — SALICYLATE LEVEL: Salicylate Lvl: <7 mg/dL — ABNORMAL LOW (ref 7.0–30.0)

## 2020-12-19 LAB — CK: Total CK: 211 U/L (ref 49–397)

## 2020-12-19 MED ORDER — LORAZEPAM 2 MG/ML IJ SOLN
2.0000 mg | Freq: Once | INTRAMUSCULAR | Status: AC
  Administered 2020-12-19: 2 mg via INTRAMUSCULAR
  Filled 2020-12-19: qty 1

## 2020-12-19 MED ORDER — ATORVASTATIN CALCIUM 40 MG PO TABS
40.0000 mg | ORAL_TABLET | Freq: Every day | ORAL | Status: DC
  Administered 2020-12-20 – 2020-12-21 (×2): 40 mg via ORAL
  Filled 2020-12-19 (×2): qty 1

## 2020-12-19 MED ORDER — ACETAMINOPHEN 650 MG RE SUPP: 650.0000 mg | Freq: Four times a day (QID) | RECTAL | Status: DC | PRN

## 2020-12-19 MED ORDER — TRAZODONE HCL 50 MG PO TABS: 25.0000 mg | ORAL_TABLET | Freq: Every evening | ORAL | Status: DC | PRN

## 2020-12-19 MED ORDER — LISINOPRIL-HYDROCHLOROTHIAZIDE 20-12.5 MG PO TABS: 1.0000 | ORAL_TABLET | Freq: Every day | ORAL | Status: DC

## 2020-12-19 MED ORDER — ACETAMINOPHEN 325 MG PO TABS: 650.0000 mg | ORAL_TABLET | Freq: Four times a day (QID) | ORAL | Status: DC | PRN

## 2020-12-19 MED ORDER — POLYETHYLENE GLYCOL 3350 17 G PO PACK
17.0000 g | PACK | Freq: Every day | ORAL | Status: DC | PRN
  Filled 2020-12-19: qty 1

## 2020-12-19 NOTE — ED Provider Notes (Signed)
[facility]-EMERGENCY DEPT Provider Note   CSN: [id number] Arrival date & time: 12/19/20  1415     History Chief Complaint  Patient presents with  . IVC  . Psychiatric Evaluation    Jordan B Sui Sr. is a 65 y.o. male.  HPI Patient is a 65 year old male presenting today with apparent psychiatric issues.  For the past 3 weeks he has been having some issues with becoming agitated and hyperverbal.  Per his wife patient took a trip 3 weeks ago which was a planned trip and for what ever reason he has since then been more agitated, hyperverbal, seemingly irate and difficult to live with.  She states that he has been wandering off by himself and not taking his medications that he was prescribed by the [facility] healthcare system after he was seen in their emergency department for similar symptoms.  His wife states that he is not taking his medications and has become more agitated again.  She brought him to the ER under IVC because of his agitation and confusion.  He has allegedly had a history of urinary tract infections.  He was treated at [facility] but patient's wife is worried that he has another UTI.  Seems that he has not taking his valproic acid that he was prescribed and may have not taken his antibiotics either.  Level 5 caveat psychiatric issue I am unable to obtain any history from patient because he is simply persistently talks and babbles tangentially    Past Medical History:  Diagnosis Date  . HLD (hyperlipidemia) 06/15/2018  . Hypertension     Patient Active Problem List   Diagnosis Date Noted  . HLD (hyperlipidemia) 06/15/2018  . Smoker 11/04/2016  . LVH (left ventricular hypertrophy) 05/01/2016  . Diastolic dysfunction 05/01/2016  . Shortened PR interval 04/22/2016  . BPH (benign prostatic hyperplasia) 04/21/2016  . HTN (hypertension) 04/21/2016    Past Surgical History:  Procedure Laterality Date  . Arm surgery    . arthroscopic knee surgery          Family History  Problem Relation Age of Onset  . Diabetes Mother   . Hypertension Mother   . Hypertension Father     Social History   Tobacco Use  . Smoking status: Current Some Day Smoker    Types: Cigars  . Smokeless tobacco: Current User  Substance Use Topics  . Alcohol use: Yes    Comment: Occasional  . Drug use: No    Home Medications Prior to Admission medications   Medication Sig Start Date End Date Taking? Authorizing Provider  atorvastatin (LIPITOR) 40 MG tablet Take 1 tablet (40 mg total) by mouth daily. 09/28/20   Sunnie Nielsen, DO  cyclobenzaprine (FLEXERIL) 10 MG tablet Take 1 tablet (10 mg total) by mouth 3 (three) times daily as needed for muscle spasms. 11/29/19   Tollie Eth, NP  ibuprofen (ADVIL,MOTRIN) 200 MG tablet Take 200 mg by mouth every 6 (six) hours as needed.    [provider]  lisinopril-hydrochlorothiazide (ZESTORETIC) 20-12.5 MG tablet Take 1 tablet by mouth daily. 09/28/20   Sunnie Nielsen, DO  Multiple Vitamins-Minerals (MULTIVITAMIN ADULT PO) Take by mouth.    [provider]  tamsulosin (FLOMAX) 0.4 MG CAPS capsule Take 1 capsule (0.4 mg total) by mouth daily. 09/28/20   Sunnie Nielsen, DO    Allergies    Patient has no known allergies.  Review of Systems   Review of Systems  Unable to perform ROS:  Psychiatric disorder    Physical Exam Updated Vital Signs BP 103/62 (BP Location: Left Arm)   Pulse 81   Temp 98.7 F (37.1 C) (Oral)   Resp 20   SpO2 100%   Physical Exam Vitals and nursing note reviewed.  Constitutional:      General: He is not in acute distress.    Appearance: Normal appearance. He is not ill-appearing.  HENT:     Head: Normocephalic and atraumatic.     Mouth/Throat:     Mouth: Mucous membranes are moist.  Eyes:     General: No scleral icterus.       Right eye: No discharge.        Left eye: No discharge.     Conjunctiva/sclera: Conjunctivae normal.  Pulmonary:      Effort: Pulmonary effort is normal.     Breath sounds: No stridor.  Neurological:     Mental Status: He is alert and oriented to person, place, and time. Mental status is at baseline.  Psychiatric:     Comments: Pressured speech, tangential thoughts Agitated but not aggressive.  Am unable to redirect patient.  Unable to obtain history given his rambling, consistent/unyielding dialogue     ED Results / Procedures / Treatments   Labs (all labs ordered are listed, but only abnormal results are displayed) Labs Reviewed  COMPREHENSIVE METABOLIC PANEL - Abnormal; Notable for the following components:      Result Value   Total Protein 6.2 (*)    Albumin 3.1 (*)    Alkaline Phosphatase 35 (*)    Total Bilirubin 0.2 (*)    Anion gap 4 (*)    All other components within normal limits  CBC WITH DIFFERENTIAL/PLATELET - Abnormal; Notable for the following components:   RBC 2.90 (*)    Hemoglobin 9.1 (*)    HCT 27.3 (*)    All other components within normal limits  SALICYLATE LEVEL - Abnormal; Notable for the following components:   Salicylate Lvl <7.0 (*)    All other components within normal limits  ACETAMINOPHEN LEVEL - Abnormal; Notable for the following components:   Acetaminophen (Tylenol), Serum <10 (*)    All other components within normal limits  RESP PANEL BY RT-PCR (FLU A&B, COVID) ARPGX2  ETHANOL  CK  RAPID URINE DRUG SCREEN, HOSP PERFORMED  URINALYSIS, ROUTINE W REFLEX MICROSCOPIC    EKG None  Radiology No results found.  Procedures Procedures   Medications Ordered in ED Medications  lisinopril-hydrochlorothiazide (ZESTORETIC) 20-12.5 MG per tablet 1 tablet (has no administration in time range)  atorvastatin (LIPITOR) tablet 40 mg (has no administration in time range)  acetaminophen (TYLENOL) tablet 650 mg (has no administration in time range)    Or  acetaminophen (TYLENOL) suppository 650 mg (has no administration in time range)  traZODone (DESYREL) tablet 25  mg (has no administration in time range)  polyethylene glycol (MIRALAX / GLYCOLAX) packet 17 g (has no administration in time range)  LORazepam (ATIVAN) injection 2 mg (2 mg Intramuscular Given 12/19/20 1522)    ED Course  I have reviewed the triage vital signs and the nursing notes.  Pertinent labs & imaging results that were available during my care of the patient were reviewed by me and considered in my medical decision making (see chart for details).    MDM Rules/Calculators/A&P                          Patient is agitated  hyperverbal and difficult interview.  I discussed with patient's wife who states that he was recently psychiatrically hospitalized at [facility] system treated for UTI and treated for potentially mania although she is uncertain of what psychiatric disease he was diagnosed with.  I reviewed care everywhere was unable to find a specific diagnosis.  Will obtain basic lab work, urine studies  Will provide patient with 2 mg of IM Ativan  Patient now resting comfortably.  Unable to interview patient given that he is unwilling.  Tylenol and salicylate and ethanol undetectable.  CK within normal limits doubt rhabdomyolysis.  CMP unremarkable.  CBC with mild anemia of hemoglobin 9.1 this is a decrease from prior.  I discussed his hemoglobin with oncoming provider who will recheck this at 9 PM tonight.    Urinalysis pending at this time he has not given [place] a urine sample yet.  COVID and influenza negative.  Urine drug screen also pending.   First exam completed signed by Dr. Jeraldine Loots and given to secretary.  IVC order placed.  Home meds ordered and as needed's.  Diet order placed.  Oncoming provider will follow up on additional labs  Final Clinical Impression(s) / ED Diagnoses Final diagnoses:  Manic behavior [facility])    Rx / DC Orders ED Discharge Orders    None       Gailen Shelter, [place] 12/19/20 Nida Boatman    Gerhard Munch, MD 12/20/20 2232

## 2020-12-19 NOTE — ED Provider Notes (Signed)
I assumed care of patient at shift change from previous team, please see their note for full H&P. Briefly patient is here IVC.  Plan is to follow-up on repeat CBC.  As long as it has not significantly dropped acutely then plan is to medically clear patient and order TTS consult.  Reportedly no evidence of trauma.   Labs Reviewed  COMPREHENSIVE METABOLIC PANEL - Abnormal; Notable for the following components:      Result Value   Total Protein 6.2 (*)    Albumin 3.1 (*)    Alkaline Phosphatase 35 (*)    Total Bilirubin 0.2 (*)    Anion gap 4 (*)    All other components within normal limits  CBC WITH DIFFERENTIAL/PLATELET - Abnormal; Notable for the following components:   RBC 2.90 (*)    Hemoglobin 9.1 (*)    HCT 27.3 (*)    All other components within normal limits  SALICYLATE LEVEL - Abnormal; Notable for the following components:   Salicylate Lvl <7.0 (*)    All other components within normal limits  ACETAMINOPHEN LEVEL - Abnormal; Notable for the following components:   Acetaminophen (Tylenol), Serum <10 (*)    All other components within normal limits  CBC - Abnormal; Notable for the following components:   RBC 3.08 (*)    Hemoglobin 9.6 (*)    HCT 28.5 (*)    All other components within normal limits  RESP PANEL BY RT-PCR (FLU A&B, COVID) ARPGX2  ETHANOL  CK  RAPID URINE DRUG SCREEN, HOSP PERFORMED  URINALYSIS, ROUTINE W REFLEX MICROSCOPIC     MDM   Plan to follow up on repeat CBC, repeat 9.6.  Patient is medically clear.  TTS is consulted. Note: Portions of this report may have been transcribed using voice recognition software. Every effort was made to ensure accuracy; however, inadvertent computerized transcription errors may be present        Norman Clay 12/19/20 2230    Gerhard Munch, MD 12/20/20 2259

## 2020-12-19 NOTE — ED Triage Notes (Signed)
Per paperwork, states patient has a know psych history-states he has frequent UTIs-states he has not been taking his meds, hyper verbal, insomnia-states he recently traveled to [facility] for no reason-wife took out papers

## 2020-12-20 DIAGNOSIS — N390 Urinary tract infection, site not specified: Secondary | ICD-10-CM | POA: Diagnosis not present

## 2020-12-20 DIAGNOSIS — F309 Manic episode, unspecified: Secondary | ICD-10-CM | POA: Diagnosis not present

## 2020-12-20 DIAGNOSIS — F3010 Manic episode without psychotic symptoms, unspecified: Secondary | ICD-10-CM | POA: Insufficient documentation

## 2020-12-20 LAB — URINALYSIS, ROUTINE W REFLEX MICROSCOPIC
Bilirubin Urine: NEGATIVE
Glucose, UA: NEGATIVE mg/dL
Hgb urine dipstick: NEGATIVE
Ketones, ur: NEGATIVE mg/dL
Nitrite: POSITIVE — AB
Protein, ur: NEGATIVE mg/dL
Specific Gravity, Urine: 1.012 (ref 1.005–1.030)
pH: 7 (ref 5.0–8.0)

## 2020-12-20 LAB — RAPID URINE DRUG SCREEN, HOSP PERFORMED
Amphetamines: NOT DETECTED
Barbiturates: NOT DETECTED
Benzodiazepines: NOT DETECTED
Cocaine: NOT DETECTED
Opiates: NOT DETECTED
Tetrahydrocannabinol: POSITIVE — AB

## 2020-12-20 MED ORDER — OLANZAPINE 5 MG PO TBDP: 5.0000 mg | ORAL_TABLET | Freq: Three times a day (TID) | ORAL | Status: DC | PRN

## 2020-12-20 MED ORDER — LORAZEPAM 1 MG PO TABS: 1.0000 mg | ORAL_TABLET | ORAL | Status: DC | PRN

## 2020-12-20 MED ORDER — CEPHALEXIN 500 MG PO CAPS
500.0000 mg | ORAL_CAPSULE | Freq: Three times a day (TID) | ORAL | Status: DC
  Administered 2020-12-20 – 2020-12-21 (×4): 500 mg via ORAL
  Filled 2020-12-20 (×4): qty 1

## 2020-12-20 MED ORDER — ZIPRASIDONE MESYLATE 20 MG IM SOLR: 20.0000 mg | INTRAMUSCULAR | Status: DC | PRN

## 2020-12-20 MED ORDER — OLANZAPINE 5 MG PO TBDP
5.0000 mg | ORAL_TABLET | Freq: Every day | ORAL | Status: DC
  Administered 2020-12-20: 5 mg via ORAL
  Filled 2020-12-20: qty 1

## 2020-12-20 MED ORDER — LISINOPRIL 20 MG PO TABS
20.0000 mg | ORAL_TABLET | Freq: Every day | ORAL | Status: DC
  Administered 2020-12-20 – 2020-12-21 (×2): 20 mg via ORAL
  Filled 2020-12-20 (×2): qty 1

## 2020-12-20 MED ORDER — HYDROCHLOROTHIAZIDE 12.5 MG PO CAPS
12.5000 mg | ORAL_CAPSULE | Freq: Every day | ORAL | Status: DC
  Administered 2020-12-20 – 2020-12-21 (×2): 12.5 mg via ORAL
  Filled 2020-12-20 (×2): qty 1

## 2020-12-20 NOTE — Progress Notes (Signed)
12/20/2020  0810  Patient refused to give urine sample. MD notified.

## 2020-12-20 NOTE — ED Notes (Signed)
Pt states he will give urine sample next time he has to go!

## 2020-12-20 NOTE — Consult Note (Signed)
[facility] Face-to-Face Psychiatry Consult   Reason for Consult:  Psychiatric evaluation related to manic behavior. Referring Physician:  Dr. Susy Frizzle Patient Identification: Jordan Span Filippi Sr. MRN:  [id number] Principal Diagnosis: Manic behavior (HCC) Diagnosis:  Principal Problem:   Manic behavior (HCC) Active Problems:   HLD (hyperlipidemia)   Total Time spent with patient: 20 minutes  Subjective:    Jordan B Formby Sr. is a 65 y.o. male patient admitted IVC with manic behaviors.  Jordan Span Fingerhut Sr., 65 y.o., male patient seen face to face by this provider, consulted with Dr. Lucianne Muss; and chart reviewed on 12/20/20.  On evaluation Jordan B Wynn Sr. reports, "I know my wife had me put in here because she thinks II was spending too much money".  HPI:     During evaluation Jordan B Veltre Sr. is standing and pacing in his room. He appears to be in no acute distress.  He is alert, oriented x 4. He does not want to answer some questions states "you don't need to know".  He is uncooperative at times. His mood is euphoric with congruent affect His speech is pressured and fast. He is hyper verbal and after this provider left room, he stands at door talking to staff. His thought process is disorganized.  He does not appear to be responding to internal/external stimuli.  Patient denies suicidal/self-harm/homicidal ideation, psychosis, and paranoia. Patient is grandiose.  Patient states "this is all a big misunderstanding". Patient refused to give urine sample initally.  Explained to patient that it was to check for a urinary tract infection.  He states that we do not need to know that.  He rambled on about how his doctor told him that he did not have a urinary tract infection and all he needed to do was drink water and he was healed. States, that is why he did not take his antibiotics when he left the hospital on his last admission.  Patient finally agreed to urine sample.  During evaluation  patient could not sit still and could stop talking.  Patient was admitted to [facility] in [place] 11/26/2020 for unspecified psychosis.  Collateral was obtained from Thornell Sartorius patient's spouse: Spouse states that patient took their car went to a hotel in [place] and booked three rooms just form himself. Stopped his car in the middle of traffic. States that he has been spending lots of money and having racing thoughts.  States patient stopped eating and had not slept for days.  Some days he stays in his car at night.  Reports this is how the patient behaved when he was diagnosed with his last urinary tract infection when he was hospitalized at [facility] in [place].  Reports patient was doing better on discharge, but states he stopped taking his medications.  Spouse reports when patient is in this "state of mind" he is not safe, he makes poor decisions. States he is impulsive.   Past Psychiatric History: unspecified psychosis  Risk to Self:  pt denies Risk to Others:  pt denies Prior Inpatient Therapy:  yes Prior Outpatient Therapy:  no  Past Medical History:  Past Medical History:  Diagnosis Date  . HLD (hyperlipidemia) 06/15/2018  . Hypertension     Past Surgical History:  Procedure Laterality Date  . Arm surgery    . arthroscopic knee surgery     Family History:  Family History  Problem Relation Age of Onset  . Diabetes Mother   . Hypertension Mother   .  Hypertension Father    Family Psychiatric  History: unknown  Social History:  Social History   Substance and Sexual Activity  Alcohol Use Yes   Comment: Occasional     Social History   Substance and Sexual Activity  Drug Use No    Social History   Socioeconomic History  . Marital status: Married    Spouse name: Not on file  . Number of children: 3  . Years of education: Not on file  . Highest education level: Not on file  Occupational History    Comment: Retired  Tobacco Use  . Smoking status:  Current Some Day Smoker    Types: Cigars  . Smokeless tobacco: Current User  Substance and Sexual Activity  . Alcohol use: Yes    Comment: Occasional  . Drug use: No  . Sexual activity: Not on file  Other Topics Concern  . Not on file  Social History Narrative  . Not on file   Social Determinants of Health   Financial Resource Strain: Not on file  Food Insecurity: Not on file  Transportation Needs: Not on file  Physical Activity: Not on file  Stress: Not on file  Social Connections: Not on file   Additional Social History:    Allergies:  No Known Allergies  Labs:  Results for orders placed or performed during the hospital encounter of 12/19/20 (from the past 48 hour(s))  Resp Panel by RT-PCR (Flu A&B, Covid) Nasopharyngeal Swab     Status: None   Collection Time: 12/19/20  3:13 PM   Specimen: Nasopharyngeal Swab; Nasopharyngeal(NP) swabs in vial transport medium  Result Value Ref Range   SARS Coronavirus 2 by RT PCR NEGATIVE NEGATIVE    Comment: (NOTE) SARS-CoV-2 target nucleic acids are NOT DETECTED.  The SARS-CoV-2 RNA is generally detectable in upper respiratory specimens during the acute phase of infection. The lowest concentration of SARS-CoV-2 viral copies this assay can detect is 138 copies/mL. A negative result does not preclude SARS-Cov-2 infection and should not be used as the sole basis for treatment or other patient management decisions. A negative result may occur with  improper specimen collection/handling, submission of specimen other than nasopharyngeal swab, presence of viral mutation(s) within the areas targeted by this assay, and inadequate number of viral copies(<138 copies/mL). A negative result must be combined with clinical observations, patient history, and epidemiological information. The expected result is Negative.  Fact Sheet for Patients:  BloggerCourse.com  Fact Sheet for Healthcare Providers:   SeriousBroker.it  This test is no t yet approved or cleared by the [place] FDA and  has been authorized for detection and/or diagnosis of SARS-CoV-2 by FDA under an Emergency Use Authorization (EUA). This EUA will remain  in effect (meaning this test can be used) for the duration of the COVID-19 declaration under Section 564(b)(1) of the Act, 21 U.S.C.section 360bbb-3(b)(1), unless the authorization is terminated  or revoked sooner.       Influenza A by PCR NEGATIVE NEGATIVE   Influenza B by PCR NEGATIVE NEGATIVE    Comment: (NOTE) The Xpert Xpress SARS-CoV-2/FLU/RSV plus assay is intended as an aid in the diagnosis of influenza from Nasopharyngeal swab specimens and should not be used as a sole basis for treatment. Nasal washings and aspirates are unacceptable for Xpert Xpress SARS-CoV-2/FLU/RSV testing.  Fact Sheet for Patients: BloggerCourse.com  Fact Sheet for Healthcare Providers: SeriousBroker.it  This test is not yet approved or cleared by the [place] and has been authorized for  detection and/or diagnosis of SARS-CoV-2 by FDA under an Emergency Use Authorization (EUA). This EUA will remain in effect (meaning this test can be used) for the duration of the COVID-19 declaration under Section 564(b)(1) of the Act, 21 U.S.C. section 360bbb-3(b)(1), unless the authorization is terminated or revoked.  Performed at [facility], [address] [place], [place] [address]   Comprehensive metabolic panel     Status: Abnormal   Collection Time: 12/19/20  4:38 PM  Result Value Ref Range   Sodium 137 135 - 145 mmol/L   Potassium 4.4 3.5 - 5.1 mmol/L   Chloride 105 98 - 111 mmol/L   CO2 28 22 - 32 mmol/L   Glucose, Bld 88 70 - 99 mg/dL    Comment: Glucose reference range applies only to samples taken after fasting for at least 8 hours.   BUN 19 8 - 23 mg/dL    Creatinine, Ser 6.040.90 0.61 - 1.24 mg/dL   Calcium 8.9 8.9 - 54.010.3 mg/dL   Total Protein 6.2 (L) 6.5 - 8.1 g/dL   Albumin 3.1 (L) 3.5 - 5.0 g/dL   AST 25 15 - 41 U/L   ALT 25 0 - 44 U/L   Alkaline Phosphatase 35 (L) 38 - 126 U/L   Total Bilirubin 0.2 (L) 0.3 - 1.2 mg/dL   GFR, Estimated >98>60 >11>60 mL/min    Comment: (NOTE) Calculated using the CKD-EPI Creatinine Equation (2021)    Anion gap 4 (L) 5 - 15    Comment: Performed at [facility], [address] [place], [place] [address]  Ethanol     Status: None   Collection Time: 12/19/20  4:38 PM  Result Value Ref Range   Alcohol, Ethyl (B) <10 <10 mg/dL    Comment: (NOTE) Lowest detectable limit for serum alcohol is 10 mg/dL.  For medical purposes only. Performed at [facility], [address] [place], [place] [address]   CBC with Diff     Status: Abnormal   Collection Time: 12/19/20  4:38 PM  Result Value Ref Range   WBC 8.1 4.0 - 10.5 K/uL   RBC 2.90 (L) 4.22 - 5.81 MIL/uL   Hemoglobin 9.1 (L) 13.0 - 17.0 g/dL   HCT 13.027.3 (L) 86.539.0 - 78.452.0 %   MCV 94.1 80.0 - 100.0 fL   MCH 31.4 26.0 - 34.0 pg   MCHC 33.3 30.0 - 36.0 g/dL   RDW 69.614.6 29.511.5 - 28.415.5 %   Platelets 200 150 - 400 K/uL   nRBC 0.0 0.0 - 0.2 %   Neutrophils Relative % 78 %   Neutro Abs 6.3 1.7 - 7.7 K/uL   Lymphocytes Relative 17 %   Lymphs Abs 1.4 0.7 - 4.0 K/uL   Monocytes Relative 5 %   Monocytes Absolute 0.4 0.1 - 1.0 K/uL   Eosinophils Relative 0 %   Eosinophils Absolute 0.0 0.0 - 0.5 K/uL   Basophils Relative 0 %   Basophils Absolute 0.0 0.0 - 0.1 K/uL   Immature Granulocytes 0 %   Abs Immature Granulocytes 0.02 0.00 - 0.07 K/uL    Comment: Performed at [facility], [address] [place], [place] [address]  Salicylate level     Status: Abnormal   Collection Time: 12/19/20  4:38 PM  Result Value Ref Range   Salicylate Lvl <7.0 (L) 7.0 - 30.0 mg/dL    Comment: Performed at [facility], [address] [place], [place]  [address]  Acetaminophen level     Status: Abnormal   Collection Time: 12/19/20  4:38 PM  Result Value Ref Range   Acetaminophen (Tylenol), Serum <10 (L) 10 - 30 ug/mL    Comment: (NOTE) Therapeutic concentrations vary significantly. A range of 10-30 ug/mL  may be an effective concentration for many patients. However, some  are best treated at concentrations outside of this range. Acetaminophen concentrations >150 ug/mL at 4 hours after ingestion  and >50 ug/mL at 12 hours after ingestion are often associated with  toxic reactions.  Performed at [facility], [address] [place], [place] [address]   CK     Status: None   Collection Time: 12/19/20  4:38 PM  Result Value Ref Range   Total CK 211 49 - 397 U/L    Comment: Performed at [facility], [address], [place], [place] [address]  CBC     Status: Abnormal   Collection Time: 12/19/20  8:46 PM  Result Value Ref Range   WBC 9.6 4.0 - 10.5 K/uL   RBC 3.08 (L) 4.22 - 5.81 MIL/uL   Hemoglobin 9.6 (L) 13.0 - 17.0 g/dL   HCT 91.4 (L) 78.2 - 95.6 %   MCV 92.5 80.0 - 100.0 fL   MCH 31.2 26.0 - 34.0 pg   MCHC 33.7 30.0 - 36.0 g/dL   RDW 21.3 08.6 - 57.8 %   Platelets 223 150 - 400 K/uL   nRBC 0.0 0.0 - 0.2 %    Comment: Performed at [facility], [address], [place], [place] [address]  Urine rapid drug screen (hosp performed)     Status: Abnormal   Collection Time: 12/20/20 11:49 AM  Result Value Ref Range   Opiates NONE DETECTED NONE DETECTED   Cocaine NONE DETECTED NONE DETECTED   Benzodiazepines NONE DETECTED NONE DETECTED   Amphetamines NONE DETECTED NONE DETECTED   Tetrahydrocannabinol POSITIVE (A) NONE DETECTED   Barbiturates NONE DETECTED NONE DETECTED    Comment: (NOTE) DRUG SCREEN FOR MEDICAL PURPOSES ONLY.  IF CONFIRMATION IS NEEDED FOR ANY PURPOSE, NOTIFY LAB WITHIN 5 DAYS.  LOWEST DETECTABLE  LIMITS FOR URINE DRUG SCREEN Drug Class                     Cutoff (ng/mL) Amphetamine and metabolites    1000 Barbiturate and metabolites    200 Benzodiazepine                 200 Tricyclics and metabolites     300 Opiates and metabolites        300 Cocaine and metabolites        300 THC                            50 Performed at [facility], [address] [place], [place] [address]   Urinalysis, Routine w reflex microscopic     Status: Abnormal   Collection Time: 12/20/20 11:49 AM  Result Value Ref Range   Color, Urine YELLOW YELLOW   APPearance HAZY (A) CLEAR   Specific Gravity, Urine 1.012 1.005 - 1.030   pH 7.0 5.0 - 8.0   Glucose, UA NEGATIVE NEGATIVE mg/dL   Hgb urine dipstick NEGATIVE NEGATIVE   Bilirubin Urine NEGATIVE NEGATIVE   Ketones, ur NEGATIVE NEGATIVE mg/dL   Protein, ur NEGATIVE NEGATIVE mg/dL   Nitrite POSITIVE (A) NEGATIVE  Leukocytes,Ua MODERATE (A) NEGATIVE   RBC / HPF 0-5 0 - 5 RBC/hpf   WBC, UA 21-50 0 - 5 WBC/hpf   Bacteria, UA MANY (A) NONE SEEN   Mucus PRESENT     Comment: Performed at [facility], [address] [place], [place] [address]    Current Facility-Administered Medications  Medication Dose Route Frequency Provider Last Rate Last Admin  . acetaminophen (TYLENOL) tablet 650 mg  650 mg Oral Q6H PRN Solon Augusta S, PA       Or  . acetaminophen (TYLENOL) suppository 650 mg  650 mg Rectal Q6H PRN Solon Augusta S, PA      . atorvastatin (LIPITOR) tablet 40 mg  40 mg Oral Daily Fondaw, Wylder S, PA   40 mg at 12/20/20 1040  . cephALEXin (KEFLEX) capsule 500 mg  500 mg Oral Q8H Pollyann Savoy, MD   500 mg at 12/20/20 1436  . lisinopril (ZESTRIL) tablet 20 mg  20 mg Oral Daily Solon Augusta S, PA   20 mg at 12/20/20 1040   And  . hydrochlorothiazide (MICROZIDE) capsule 12.5 mg  12.5 mg Oral Daily Fondaw, Wylder S, PA   12.5 mg at 12/20/20 1040  . polyethylene glycol (MIRALAX / GLYCOLAX) packet  17 g  17 g Oral Daily PRN Solon Augusta S, PA      . traZODone (DESYREL) tablet 25 mg  25 mg Oral QHS PRN Solon Augusta S, PA       Current Outpatient Medications  Medication Sig Dispense Refill  . atorvastatin (LIPITOR) 40 MG tablet Take 1 tablet (40 mg total) by mouth daily. 90 tablet 3  . ibuprofen (ADVIL,MOTRIN) 200 MG tablet Take 200 mg by mouth every 6 (six) hours as needed for fever, headache or mild pain.    Marland Kitchen lisinopril-hydrochlorothiazide (ZESTORETIC) 20-12.5 MG tablet Take 1 tablet by mouth daily. 90 tablet 3  . Multiple Vitamin (MULTIVITAMIN WITH MINERALS) TABS tablet Take 1 tablet by mouth daily.    . tamsulosin (FLOMAX) 0.4 MG CAPS capsule Take 1 capsule (0.4 mg total) by mouth daily. 90 capsule 3    Musculoskeletal: Strength & Muscle Tone: within normal limits Gait & Station: normal Patient leans: N/A  Psychiatric Specialty Exam:  Presentation  General Appearance: Disheveled  Eye Contact:Fleeting  Speech:Pressured  Speech Volume:Increased  Handedness:Right   Mood and Affect  Mood:Euphoric  Affect:Congruent   Thought Process  Thought Processes:Coherent  Descriptions of Associations:Tangential  Orientation:Full (Time, Place and Person)  Thought Content:Delusions; Tangential  History of Schizophrenia/Schizoaffective disorder:No  Duration of Psychotic Symptoms:No data recorded Hallucinations:Hallucinations: None  Ideas of Reference:None  Suicidal Thoughts:Suicidal Thoughts: No  Homicidal Thoughts:Homicidal Thoughts: No   Sensorium  Memory:Immediate Fair; Remote Fair; Recent Fair  Judgment:Poor  Insight:Lacking   Executive Functions  Concentration:Poor  Attention Span:Poor  Recall:Fair  Fund of Knowledge:Fair  Language:Fair   Psychomotor Activity  Psychomotor Activity:Psychomotor Activity: Increased   Assets  Assets:Communication Skills; Desire for Improvement; Financial Resources/Insurance; Housing; Intimacy; Leisure  Time; Physical Health; Resilience; Social Support; Transportation; Vocational/Educational   Sleep  Sleep:Sleep: Poor   Physical Exam: Physical Exam Vitals reviewed.  HENT:     Head: Normocephalic.     Right Ear: Tympanic membrane normal.     Left Ear: Tympanic membrane normal.     Nose: Nose normal.     Mouth/Throat:     Mouth: Mucous membranes are dry.     Pharynx: Oropharynx is clear.  Eyes:     Conjunctiva/sclera: Conjunctivae normal.  Cardiovascular:     Rate and Rhythm: Normal rate.     Pulses: Normal pulses.  Pulmonary:     Effort: Pulmonary effort is normal.  Abdominal:     Tenderness: There is no guarding.  Musculoskeletal:        General: Normal range of motion.     Cervical back: Normal range of motion.  Skin:    General: Skin is warm and dry.     Capillary Refill: Capillary refill takes less than 2 seconds.  Neurological:     Mental Status: He is alert.  Psychiatric:        Attention and Perception: He is inattentive.        Mood and Affect: Mood is elated.        Speech: Speech is rapid and pressured and tangential.        Behavior: Behavior is hyperactive. Behavior is cooperative.        Thought Content: Thought content is delusional.        Cognition and Memory: Cognition normal.        Judgment: Judgment is impulsive.    ROS Blood pressure (!) 143/88, pulse (!) 101, temperature 98.6 F (37 C), temperature source Oral, resp. rate 20, SpO2 100 %. There is no height or weight on file to calculate BMI.  Treatment Plan Summary: Daily contact with patient to assess and evaluate symptoms and progress in treatment and Medication management   Added Zyprexa Zytis 5mg  PO QHS  Added agitation protocol: Zyprexa Zytis 5mg  PO Q8H PRN, Ativan 1 mg PO one time dose, Geodon 20 mg IM one time dose  Ordered EKG   Disposition: Recommend psychiatric Inpatient admission when medically cleared.  , NP 12/20/2020 8:24 PM

## 2020-12-20 NOTE — ED Notes (Signed)
Pt refused to give a urine sample, said he would rather pee on himself.

## 2020-12-20 NOTE — ED Provider Notes (Signed)
Emergency Medicine Observation Re-evaluation Note  Jordan B Younglove Sr. is a 65 y.o. male, seen on rounds today.  Pt initially presented to the ED for complaints of IVC and Psychiatric Evaluation Currently, the patient is resting comfortably.  Physical Exam  BP 115/78 (BP Location: Right Arm)   Pulse 90   Temp 98.6 F (37 C) (Oral)   Resp 17   SpO2 97%  Physical Exam General: No distress Lungs: Resp even and unlabored Psych: Sleeping soundly  ED Course / MDM  EKG:   I have reviewed the labs performed to date as well as medications administered while in observation.  Recent changes in the last 24 hours include refused to provide urine specimen.  Plan  Current plan is for TTS evaluation. Patient is under full IVC at this time.   Pollyann Savoy, MD 12/20/20 [phone]

## 2020-12-20 NOTE — BH Assessment (Signed)
[facility] Assessment Progress Note  Per Vernard Gambles, NP this pt requires psychiatric hospitalization at this time.  Pt presents under IVC initiated by pt's wife and upheld by EDP Gerhard Munch, MD.  At 14:33 I submitted pt to [facility], RN, [facility] for consideration for admission to [facility].  Decision is pending as of this writing.  Doylene Canning, [place] Behavioral Health Coordinator [phone]

## 2020-12-20 NOTE — ED Notes (Signed)
Dinner tray given

## 2020-12-20 NOTE — BH Assessment (Addendum)
Comprehensive Clinical Assessment (CCA) Note  12/20/2020 Jordan Lindstrom [employer] [id number] Disposition:  Clinician discussed patient care with Darrol Angel, NP.  She recommended that patient be observed overnight and have psychiatry to review patient.  Pt has had UTI recently but has been inconsistent with taking all his meds.  At this time there is not a UA due to patient not providing specimen.  Nurse Junior was notified of disposition recommendation via secure messaging.  Pt placed on IVC by family member. Pt has been very manic lately per IVC papers. He denies any SI or HI. Pt denies any A/V hallcinations  Patient is on IVC. He denies any SI, HI or A/V hallucinations. Per IVC papers patient has been manic lately and not thinking clearly. Pt does talk a lot and his hyperverbal. He talks about the events of today. Pt also talks about his daughter being upset about her grandfather and how he treated her when she was 65 years old (she is in her 19's now). Pt was recently at [facility] and discharged on 12-11-20. Pt gave verbal permission to talk to his wife. She said that he had gone fishing from the Tuesday before Easter weekend through that weekend. Wife said that patient would not talk to her on the phone but would text her. She said that the texts were "all over the place" and did not make sense. On the Saturday of Easter weekend.patient had been lost in [facility]. Patient had complained of his face swelling. Family had to contacted the police down there to file a missing person report. Family found out that he was at a [employer] in [facility]. Family met him there and convinced him to head back home. They took him to [facility] in [place]. He was dehydrated and not thinking clearly. Family IVC'ed him and he was at [facility] He was there for a week and a half and was discharged 12-11-20. He was treated for a UTI, he has a hx of UTI. When he was discharged he did well but did  not want to take one of the meds because it made him feel sedated. Wife said that due to his recent behavior patient has had his car keys taken from him by family. Wife said that patient has had mood swings, talking all night, pacing. Today (05/11)patient went to [place]. The police at [facility] called wife to say that patient was acting strangely. He had almost run out of gas on the main road. Pt's son in law went to get him and pt agreed to go home with him. Son in law managed to get his car parked for him when he picked him up. Wife said that he was talking non-stop and not making much sense. He was IVC'ed by family today (05/11). Wife wants him to get the right medication to make him the way he used to be. Wife said that patient had never acted this way before when he had a UTI.  Patient has good eye contact.  He talks constantly and will have some pieces of conversation make sense and match up with what wife reports.  Patient is disorganized in his speech.  Patient does not appear to be responding to internal stimuli.  Patient is not delusional but is unclear in his thought process.  Pt reports weight loss due to lack of appetite.  Patient has been having poor sleep lately also, staying up at all hours.    Pt was at  [facility] and was discharged on 12/11/20.  Pt has no current outpatient care.    Chief Complaint:  Chief Complaint  Patient presents with  . IVC  . Psychiatric Evaluation   Visit Diagnosis: Altered Mental status   CCA Screening, Triage and Referral (STR)  Patient Reported Information How did you hear about [place]? Legal System (Pt was brought to [facility] by law enforcement.)  Referral name: No data recorded Referral phone number: No data recorded  Whom do you see for routine medical problems? Primary Care  Practice/Facility Name: Primary Care at [facility]  Practice/Facility Phone Number: No data recorded Name of Contact: No data  recorded Contact Number: No data recorded Contact Fax Number: No data recorded Prescriber Name: No data recorded Prescriber Address (if known): No data recorded  What Is the Reason for Your Visit/Call Today? Pt placed on IVC by family member.  Pt has been very manic lately per IVC papers.  He denies any SI or HI.  Pt denies any A/V hallcinations.  How Long Has This Been Causing You Problems? 1 wk - 1 month  What Do You Feel Would Help You the Most Today? Treatment for Depression or other mood problem   Have You Recently Been in Any Inpatient Treatment (Hospital/Detox/Crisis Center/28-Day Program)? Yes  Name/Location of Program/Hospital:[facility]  How Long Were You There? 10 days  When Were You Discharged? 12/11/2020   Have You Ever Received Services From [employer] Before? Yes  Who Do You See at [facility]? [place]   Have You Recently Had Any Thoughts About Hurting Yourself? No  Are You Planning to Commit Suicide/Harm Yourself At This time? No   Have you Recently Had Thoughts About [place]? No  Explanation: No data recorded  Have You Used Any Alcohol or Drugs in the Past 24 Hours? No  How Long Ago Did You Use Drugs or Alcohol? No data recorded What Did You Use and How Much? No data recorded  Do You Currently Have a Therapist/Psychiatrist? No ("I don't need one.")  Name of Therapist/Psychiatrist: No data recorded  Have You Been Recently Discharged From Any Office Practice or Programs? No  Explanation of Discharge From Practice/Program: No data recorded    CCA Screening Triage Referral Assessment Type of Contact: Tele-Assessment  Is this Initial or Reassessment? Initial Assessment  Date Telepsych consult ordered in [facility]:  12/19/2020  Time Telepsych consult ordered in [facility]:  2122   Patient Reported Information Reviewed? No data recorded Patient Left Without Being Seen? No data recorded Reason for Not Completing  Assessment: No data recorded  Collateral Involvement: No data recorded  Does Patient Have a Dean? No data recorded Name and Contact of Legal Guardian: No data recorded If Minor and Not Living with Parent(s), Who has Custody? No data recorded Is CPS involved or ever been involved? No data recorded Is APS involved or ever been involved? Never   Patient Determined To Be At Risk for Harm To Self or Others Based on Review of Patient Reported Information or Presenting Complaint? No  Method: No data recorded Availability of Means: No data recorded Intent: No data recorded Notification Required: No data recorded Additional Information for Danger to Others Potential: No data recorded Additional Comments for Danger to Others Potential: No data recorded Are There Guns or Other Weapons in Your Home? No data recorded Types of Guns/Weapons: No data recorded Are These Weapons Safely Secured?  No data recorded Who Could Verify You Are Able To Have These Secured: No data recorded Do You Have any Outstanding Charges, Pending Court Dates, Parole/Probation? No data recorded Contacted To Inform of Risk of Harm To Self or Others: No data recorded  Location of Assessment: [facility]   Does Patient Present under Involuntary Commitment? Yes  IVC Papers Initial File Date: 12/19/2020   [place] of Residence: [place]   Patient Currently Receiving the Following Services: Not Receiving Services   Determination of Need: Urgent (48 hours)   Options For Referral: Other: Comment (Pt to be observed overnight and be seen by psychiatry in AM.  Pt has not had a UA yet and he may have a UTI still.)     CCA Biopsychosocial Intake/Chief Complaint:  Patient is on IVC.  He denies any SI, HI or A/V hallucinations.  Per IVC papers patient has been manic lately and not thinking clearly.  Pt does talk a lot and his hyperverbal.  He talks about the events of today.  Pt  also talks about his daughter being upset about her grandfather and how he treated her when she was 65 years old (she is in her 38's now).  Pt was recently at [facility] and discharged on 12-11-20.  Pt gave verbal permission to talk to his wife.  She said that he had gone fishing  from the Tuesday before Easter weekend through that weekend.  Wife said that patient would not talk to her on the phone but would text her.  She said that the texts were "all over the place" and did not make sense.  On the Saturday of Easter weekend.patient had been lost in [facility].  Patient had complained of his face swelling.  Family had to contacted the police down there to file a missing person report.  Family found out that he was at a [employer] in [facility].  Family met him there and convinced him to head back home.  They took him to [facility] in [place].  He was dehydrated and not thinking clearly.  Family IVC'ed him and he was at [facility] He was there for a week and a half and was discharged 12-11-20.  He was treated for a UTI, he has a hx of UTI.  When he was discharged he did well but did not want to take one of the meds because it made him feel sedated.  Wife said that due to his recent behavior patient has had his car keys taken from him by family.  Wife said that patient has had mood swings, talking all night, pacing.  Today (05/11)patient went to [place].  The police at [facility] called wife to say that patient was acting strangely.  He had almost run out of gas on the main road.  Pt's son in law went to get him and pt agreed to go home with him.  Son in law managed to get his car parked for him when he picked him up.  Wife said that he was talking non-stop and not making much sense.  He was IVC'ed by family today (05/11).  Wife wants him to get the right medication to make him the way he used to be.  Wife said that patient had never acted this way before when he had a UTI.  Current  Symptoms/Problems: Pt is manic. talking constantly.   Patient Reported Schizophrenia/Schizoaffective Diagnosis in Past: No   Strengths: No data recorded Preferences: No data  recorded Abilities: No data recorded  Type of Services Patient Feels are Needed: No data recorded  Initial Clinical Notes/Concerns: No data recorded  Mental Health Symptoms Depression:  None   Duration of Depressive symptoms: No data recorded  Mania:  Racing thoughts   Anxiety:   Restlessness   Psychosis:  None   Duration of Psychotic symptoms: No data recorded  Trauma:  None   Obsessions:  None   Compulsions:  None   Inattention:  None   Hyperactivity/Impulsivity:  N/A   Oppositional/Defiant Behaviors:  None   Emotional Irregularity:  None   Other Mood/Personality Symptoms:  No data recorded   Mental Status Exam Appearance and self-care  Stature:  Tall   Weight:  Average weight   Clothing:  Casual   Grooming:  Normal   Cosmetic use:  None   Posture/gait:  No data recorded  Motor activity:  No data recorded  Sensorium  Attention:  Distractible   Concentration:  Focuses on irrelevancies   Orientation:  X5   Recall/memory:  Defective in Short-term   Affect and Mood  Affect:  Appropriate   Mood:  No data recorded  Relating  Eye contact:  No data recorded  Facial expression:  No data recorded  Attitude toward examiner:  Cooperative   Thought and Language  Speech flow: Slurred   Thought content:  Appropriate to Mood and Circumstances   Preoccupation:  Other (Comment) (Talking about family.)   Hallucinations:  None   Organization:  No data recorded  [employer] of Knowledge:  No data recorded  Intelligence:  Average   Abstraction:  Functional   Judgement:  Poor   Reality Testing:  Distorted   Insight:  Flashes of insight   Decision Making:  Impulsive   Social Functioning  Social Maturity:  Responsible   Social Judgement:  Normal   Stress   Stressors:  Family conflict   Coping Ability:  Normal   Skill Deficits:  Decision making   Supports:  Family     Religion:    Leisure/Recreation:    Exercise/Diet: Exercise/Diet Have You Gained or Lost A Significant Amount of Weight in the Past Six Months?: Yes-Lost Number of Pounds Lost?: 12 Do You Have Any Trouble Sleeping?: Yes Explanation of Sleeping Difficulties: 6-7 hours   CCA Employment/Education Employment/Work Situation: Employment / Work Situation Employment situation: Retired Has patient ever been in the [employer]?: No  Education: Education Is Patient Currently Attending School?: No Did Physicist, medical?: Yes Did Heritage manager?: No   CCA Family/Childhood History Family and Relationship History: Family history Marital status: Married Does patient have children?: Yes How many children?: 3  Childhood History:  Childhood History By whom was/is the patient raised?: Both parents Does patient have siblings?: Yes Number of Siblings: 5 Did patient suffer any verbal/emotional/physical/sexual abuse as a child?: No Did patient suffer from severe childhood neglect?: No Has patient ever been sexually abused/assaulted/raped as an adolescent or adult?: No Was the patient ever a victim of a crime or a disaster?: No Witnessed domestic violence?: No Has patient been affected by domestic violence as an adult?: No  Child/Adolescent Assessment:     CCA Substance Use Alcohol/Drug Use: Alcohol / Drug Use Pain Medications: None Prescriptions: Lisinopril, something for cholesterol, Flowmax Over the Counter: None History of alcohol / drug use?: Yes (Will use ETOH on the weekend.)  ASAM's:  Six Dimensions of Multidimensional Assessment  Dimension 1:  Acute Intoxication and/or Withdrawal Potential:      Dimension 2:  Biomedical Conditions and Complications:      Dimension 3:  Emotional, Behavioral, or  Cognitive Conditions and Complications:     Dimension 4:  Readiness to Change:     Dimension 5:  Relapse, Continued use, or Continued Problem Potential:     Dimension 6:  Recovery/Living Environment:     ASAM Severity Score:    ASAM Recommended Level of Treatment:     Substance use Disorder (SUD)    Recommendations for Services/Supports/Treatments:    DSM5 Diagnoses: Patient Active Problem List   Diagnosis Date Noted  . HLD (hyperlipidemia) 06/15/2018  . Smoker 11/04/2016  . LVH (left ventricular hypertrophy) 05/01/2016  . Diastolic dysfunction 85/50/1586  . Shortened PR interval 04/22/2016  . BPH (benign prostatic hyperplasia) 04/21/2016  . HTN (hypertension) 04/21/2016    Patient Centered Plan: Patient is on the following Treatment Plan(s):  Impulse Control   Referrals to Alternative Service(s): Referred to Alternative Service(s):   Place:   Date:   Time:    Referred to Alternative Service(s):   Place:   Date:   Time:    Referred to Alternative Service(s):   Place:   Date:   Time:    Referred to Alternative Service(s):   Place:   Date:   Time:     Waldron Session

## 2020-12-21 ENCOUNTER — Ambulatory Visit: Payer: Medicare Other | Admitting: Osteopathic Medicine

## 2020-12-21 DIAGNOSIS — N390 Urinary tract infection, site not specified: Secondary | ICD-10-CM | POA: Diagnosis present

## 2020-12-21 DIAGNOSIS — F309 Manic episode, unspecified: Secondary | ICD-10-CM | POA: Diagnosis not present

## 2020-12-21 DIAGNOSIS — R4182 Altered mental status, unspecified: Secondary | ICD-10-CM | POA: Diagnosis present

## 2020-12-21 MED ORDER — CEPHALEXIN 500 MG PO CAPS: 500.0000 mg | ORAL_CAPSULE | Freq: Four times a day (QID) | ORAL | 0 refills | Status: DC

## 2020-12-21 NOTE — BH Assessment (Signed)
Spoke with patient's wife Jordan Coffey [phone] who states she has no safety concerns in reference to patient returning home this date and will assist with transportation on discharge.

## 2020-12-21 NOTE — BH Assessment (Signed)
[facility] Assessment Progress Note  Per Maxie Barb, NP, this pt does not require psychiatric hospitalization at this time.  Pt presents under IVC initiated by pt's wife and upheld by EDP Gerhard Munch, MD, which has been rescinded by Nelly Rout, MD.  Pt is psychiatrically cleared.  Discharge instructions include referral information for area geriatric psychiatry specialists.  At 13:08 this Clinical research associate received a call from pt's wife, Adela Lank.  I reviewed disposition with pt and responded to her questions.  She reports that she will present at [facility] at 17:00 to pick pt up.  EDP Kristine Royal, MD and pt's nurse, Waynetta Sandy, have been notified.  Doylene Canning, MA Triage Specialist [phone]

## 2020-12-21 NOTE — ED Notes (Signed)
Pt DCd off unit to home per provider. Pt alert, calm, cooperative, no s/s of distress. DC information given to and reviewed with pt, pt acknowledged understanding. Belongings given to pt. Pt ambulatory off unit, escorted by NT. Pt transported by family member.

## 2020-12-21 NOTE — ED Notes (Signed)
Patient stated he did not need an EKG. That was a waste of money, his doctors know him and his numbers are fine. RN attempted to explain that this was used to detect any dysrhythmias. Unsure if patient completely understand. May want to revisit the obtaining of EKG in morning.

## 2020-12-21 NOTE — Consult Note (Signed)
[facility] Face-to-Face Psychiatry Consult   Reason for Consult:  Psych consult Referring Physician:  Lyndel Safe, PA-C Patient Identification: Jordan Span Kana Sr. MRN:  [id number] Principal Diagnosis: Altered mental status, unspecified Diagnosis:  Principal Problem:   Altered mental status, unspecified Active Problems:   Urinary tract infection   HLD (hyperlipidemia)  Total Time spent with patient: 20 minutes  Subjective:   Jordan B Point Sr. is a 65 y.o. male patient admitted via IVC for change in mental status and possible manic behaviors. Patient has recent history of mental status changes related to UTI, antibiotic non-compliance, and complicated UTIs. UA+ UTI; Keflex initiated.   On assessment patient presents alert and oriented to person, place, time, and situation; patient is calm and cooperative. Patient completed serial 2's, 7's, 3 word recall, proverb interpretation, and clock test. Patient verbalized recent incident where he "ended up in [place]" and being hospitalized as a result. Patient denies any thoughts of depression or anxiety; states he feels "uncomfortable" and "ready to go home". Patient denies any thoughts of wanting to harm himself or others, auditory or visual hallucinations, and does not appear actively psychotic or responding to any external/internal stimuli at this time.   HPI:   Jordan Yuhas Hannold Sr is a 65 year old male patient admitted via IVC for recent change in mental status and "not thinking clearly". Patient was recently hospitalized at [facility] in [place] for similar presentation; patient was positive for UTI, discharged 12/11/20, per chart review patient was inconsistently taking antibiotics. No other psych history noted.   Past Psychiatric History:  -AMS 2/2 UTI 12/11/20  Risk to Self:  pt denies Risk to Others:  pt denies Prior Inpatient Therapy:   yes Prior Outpatient Therapy:  pt denies  Past Medical History:  Past  Medical History:  Diagnosis Date  . HLD (hyperlipidemia) 06/15/2018  . Hypertension     Past Surgical History:  Procedure Laterality Date  . Arm surgery    . arthroscopic knee surgery     Family History:  Family History  Problem Relation Age of Onset  . Diabetes Mother   . Hypertension Mother   . Hypertension Father    Family Psychiatric  History:   -not noted Social History:  Social History   Substance and Sexual Activity  Alcohol Use Yes   Comment: Occasional     Social History   Substance and Sexual Activity  Drug Use No    Social History   Socioeconomic History  . Marital status: Married    Spouse name: Not on file  . Number of children: 3  . Years of education: Not on file  . Highest education level: Not on file  Occupational History    Comment: Retired  Tobacco Use  . Smoking status: Current Some Day Smoker    Types: Cigars  . Smokeless tobacco: Current User  Substance and Sexual Activity  . Alcohol use: Yes    Comment: Occasional  . Drug use: No  . Sexual activity: Not on file  Other Topics Concern  . Not on file  Social History Narrative  . Not on file   Social Determinants of Health   Financial Resource Strain: Not on file  Food Insecurity: Not on file  Transportation Needs: Not on file  Physical Activity: Not on file  Stress: Not on file  Social Connections: Not on file   Additional Social History:   Allergies:  No Known Allergies  Labs:  Results for orders placed  or performed during the hospital encounter of 12/19/20 (from the past 48 hour(s))  CBC     Status: Abnormal   Collection Time: 12/19/20  8:46 PM  Result Value Ref Range   WBC 9.6 4.0 - 10.5 K/uL   RBC 3.08 (L) 4.22 - 5.81 MIL/uL   Hemoglobin 9.6 (L) 13.0 - 17.0 g/dL   HCT 19.128.5 (L) 47.839.0 - 29.552.0 %   MCV 92.5 80.0 - 100.0 fL   MCH 31.2 26.0 - 34.0 pg   MCHC 33.7 30.0 - 36.0 g/dL   RDW 62.114.2 30.811.5 - 65.715.5 %   Platelets 223 150 - 400 K/uL   nRBC 0.0 0.0 - 0.2 %    Comment:  Performed at [facility], [address] [place], [place] [address]  Urine rapid drug screen (hosp performed)     Status: Abnormal   Collection Time: 12/20/20 11:49 AM  Result Value Ref Range   Opiates NONE DETECTED NONE DETECTED   Cocaine NONE DETECTED NONE DETECTED   Benzodiazepines NONE DETECTED NONE DETECTED   Amphetamines NONE DETECTED NONE DETECTED   Tetrahydrocannabinol POSITIVE (A) NONE DETECTED   Barbiturates NONE DETECTED NONE DETECTED    Comment: (NOTE) DRUG SCREEN FOR MEDICAL PURPOSES ONLY.  IF CONFIRMATION IS NEEDED FOR ANY PURPOSE, NOTIFY LAB WITHIN 5 DAYS.  LOWEST DETECTABLE LIMITS FOR URINE DRUG SCREEN Drug Class                     Cutoff (ng/mL) Amphetamine and metabolites    1000 Barbiturate and metabolites    200 Benzodiazepine                 200 Tricyclics and metabolites     300 Opiates and metabolites        300 Cocaine and metabolites        300 THC                            50 Performed at [facility], [address] [place], [place] [address]   Urinalysis, Routine w reflex microscopic     Status: Abnormal   Collection Time: 12/20/20 11:49 AM  Result Value Ref Range   Color, Urine YELLOW YELLOW   APPearance HAZY (A) CLEAR   Specific Gravity, Urine 1.012 1.005 - 1.030   pH 7.0 5.0 - 8.0   Glucose, UA NEGATIVE NEGATIVE mg/dL   Hgb urine dipstick NEGATIVE NEGATIVE   Bilirubin Urine NEGATIVE NEGATIVE   Ketones, ur NEGATIVE NEGATIVE mg/dL   Protein, ur NEGATIVE NEGATIVE mg/dL   Nitrite POSITIVE (A) NEGATIVE   Leukocytes,Ua MODERATE (A) NEGATIVE   RBC / HPF 0-5 0 - 5 RBC/hpf   WBC, UA 21-50 0 - 5 WBC/hpf   Bacteria, UA MANY (A) NONE SEEN   Mucus PRESENT     Comment: Performed at [facility], [address] [place], [place] [address]    Current Facility-Administered Medications  Medication Dose Route Frequency Provider Last Rate Last Admin  . acetaminophen (TYLENOL) tablet 650 mg   650 mg Oral Q6H PRN Solon AugustaFondaw, Wylder S, PA       Or  . acetaminophen (TYLENOL) suppository 650 mg  650 mg Rectal Q6H PRN Solon AugustaFondaw, Wylder S, PA      . atorvastatin (LIPITOR) tablet 40 mg  40 mg Oral Daily Solon AugustaFondaw, Wylder S, PA   40 mg at 12/21/20 0956  . cephALEXin (KEFLEX) capsule  500 mg  500 mg Oral Q8H Pollyann Savoy, MD   500 mg at 12/21/20 1353  . lisinopril (ZESTRIL) tablet 20 mg  20 mg Oral Daily Solon Augusta S, [place]   20 mg at 12/21/20 2878   And  . hydrochlorothiazide (MICROZIDE) capsule 12.5 mg  12.5 mg Oral Daily Solon Augusta S, PA   12.5 mg at 12/21/20 0955  . OLANZapine zydis (ZYPREXA) disintegrating tablet 5 mg  5 mg Oral Q8H PRN Ardis Hughs, NP       And  . LORazepam (ATIVAN) tablet 1 mg  1 mg Oral PRN Ardis Hughs, NP       And  . ziprasidone (GEODON) injection 20 mg  20 mg Intramuscular PRN Ardis Hughs, NP      . OLANZapine zydis (ZYPREXA) disintegrating tablet 5 mg  5 mg Oral QHS Ardis Hughs, NP   5 mg at 12/20/20 2133  . polyethylene glycol (MIRALAX / GLYCOLAX) packet 17 g  17 g Oral Daily PRN Solon Augusta S, PA      . traZODone (DESYREL) tablet 25 mg  25 mg Oral QHS PRN Solon Augusta S, PA       Current Outpatient Medications  Medication Sig Dispense Refill  . atorvastatin (LIPITOR) 40 MG tablet Take 1 tablet (40 mg total) by mouth daily. 90 tablet 3  . cephALEXin (KEFLEX) 500 MG capsule Take 1 capsule (500 mg total) by mouth 4 (four) times daily. 20 capsule 0  . ibuprofen (ADVIL,MOTRIN) 200 MG tablet Take 200 mg by mouth every 6 (six) hours as needed for fever, headache or mild pain.    Marland Kitchen lisinopril-hydrochlorothiazide (ZESTORETIC) 20-12.5 MG tablet Take 1 tablet by mouth daily. 90 tablet 3  . Multiple Vitamin (MULTIVITAMIN WITH MINERALS) TABS tablet Take 1 tablet by mouth daily.    . tamsulosin (FLOMAX) 0.4 MG CAPS capsule Take 1 capsule (0.4 mg total) by mouth daily. 90 capsule 3    Musculoskeletal: Strength & Muscle Tone: within  normal limits Gait & Station: normal Patient leans: N/A  Psychiatric Specialty Exam:  Presentation  General Appearance: Disheveled  Eye Contact:Fleeting  Speech:Pressured  Speech Volume:Increased  Handedness:Right   Mood and Affect  Mood:Euphoric  Affect:Congruent   Thought Process  Thought Processes:Coherent  Descriptions of Associations:Tangential  Orientation:Full (Time, Place and Person)  Thought Content:Delusions; Tangential  History of Schizophrenia/Schizoaffective disorder:No  Duration of Psychotic Symptoms:No data recorded Hallucinations:Hallucinations: None  Ideas of Reference:None  Suicidal Thoughts:Suicidal Thoughts: No  Homicidal Thoughts:Homicidal Thoughts: No   Sensorium  Memory:Immediate Fair; Remote Fair; Recent Fair  Judgment:Poor  Insight:Lacking  Executive Functions  Concentration:Poor  Attention Span:Poor  Recall:Fair  Fund of Knowledge:Fair  Language:Fair  Psychomotor Activity  Psychomotor Activity:Psychomotor Activity: Increased  Assets  Assets:Communication Skills; Desire for Improvement; Financial Resources/Insurance; Housing; Intimacy; Leisure Time; Physical Health; Resilience; Social Support; Transportation; Vocational/Educational  Sleep  Sleep:Sleep: Poor   Physical Exam: Physical Exam Psychiatric:        Attention and Perception: Attention and perception normal.        Mood and Affect: Mood and affect normal.        Speech: Speech normal.        Behavior: Behavior normal. Behavior is cooperative.        Thought Content: Thought content normal.        Cognition and Memory: Cognition normal.        Judgment: Judgment normal.    Review of Systems  All other systems reviewed  and are negative.  Blood pressure 111/79, pulse 80, temperature 98.2 F (36.8 C), temperature source Oral, resp. rate 16, SpO2 100 %. There is no height or weight on file to calculate BMI.  Treatment Plan Summary: Plan Safety plan  with family and discharge patient home with follow up to PCP.   Disposition: No evidence of imminent risk to self or others at present.   Patient does not meet criteria for psychiatric inpatient admission. Supportive therapy provided about ongoing stressors. Discussed crisis plan, support from social network, calling 911, coming to the Emergency Department, and calling Suicide Hotline.  Loletta Parish, NP 12/21/2020 7:03 PM

## 2020-12-21 NOTE — Discharge Instructions (Addendum)
For your behavioral health needs, you are advised to follow up with an outpatient psychiatrist.  Contact one of the following providers at your earliest opportunity to ask about scheduling an initial appointment:       Neila Gear, MD      [facility]      [address]      [place], [place] [address]      [phone]       Archer Asa, MD      [facility]      [address]      [place], [place] [address]      [phone]   We sent a prescription to your pharmacy to treat a urinary tract infection.  Your urine culture is still pending and should result by tomorrow.  This will tell us for sure if you have a urinary tract infection.  You can check the results on MyChart.  In the meantime take the antibiotic that was prescribed and sent to your pharmacy, to treat a UTI.  Make sure you are drinking plenty of water.  Use Tylenol for fever or pain.

## 2020-12-21 NOTE — ED Provider Notes (Signed)
Emergency Medicine Observation Re-evaluation Note  Jordan B Kleinschmidt Sr. is a 65 y.o. male, seen on rounds today.  Pt initially presented to the ED for complaints of IVC and Psychiatric Evaluation Currently, the patient is asleep.  Physical Exam  BP 100/67 (BP Location: Right Arm)   Pulse 77   Temp 98.2 F (36.8 C) (Oral)   Resp 17   SpO2 100%  Physical Exam General: NAD, asleep   ED Course / MDM  EKG:   I have reviewed the labs performed to date as well as medications administered while in observation.  Recent changes in the last 24 hours include no acute events reported by staff.  Plan  Current plan is for inpatient placement. Patient is under full IVC at this time.   Wynetta Fines, MD 12/21/20 [phone]

## 2020-12-23 LAB — URINE CULTURE: Culture: >=100000 — AB

## 2020-12-24 ENCOUNTER — Telehealth: Payer: Self-pay | Admitting: Emergency Medicine

## 2020-12-24 NOTE — Progress Notes (Addendum)
This patient has been discussed with the student pharmacist and I agree with the plans below. Please see their note for complete details.  Enzo Bi, PharmD, BCPS, BCIDP Clinical Pharmacist 12/24/2020 10:18 AM   ED Antimicrobial Stewardship Positive Culture Follow Up   Zaydyn B Basista Sr. is an 65 y.o. male who presented to [facility] on 12/19/2020 with a chief complaint of  Chief Complaint  Patient presents with   IVC   Psychiatric Evaluation    Recent Results (from the past 720 hour(s))  Resp Panel by RT-PCR (Flu A&B, Covid) Nasopharyngeal Swab     Status: None   Collection Time: 12/19/20  3:13 PM   Specimen: Nasopharyngeal Swab; Nasopharyngeal(NP) swabs in vial transport medium  Result Value Ref Range Status   SARS Coronavirus 2 by RT PCR NEGATIVE NEGATIVE Final    Comment: (NOTE) SARS-CoV-2 target nucleic acids are NOT DETECTED.  The SARS-CoV-2 RNA is generally detectable in upper respiratory specimens during the acute phase of infection. The lowest concentration of SARS-CoV-2 viral copies this assay can detect is 138 copies/mL. A negative result does not preclude SARS-Cov-2 infection and should not be used as the sole basis for treatment or other patient management decisions. A negative result may occur with  improper specimen collection/handling, submission of specimen other than nasopharyngeal swab, presence of viral mutation(s) within the areas targeted by this assay, and inadequate number of viral copies(<138 copies/mL). A negative result must be combined with clinical observations, patient history, and epidemiological information. The expected result is Negative.  Fact Sheet for Patients:  BloggerCourse.com  Fact Sheet for Healthcare Providers:  SeriousBroker.it  This test is no t yet approved or cleared by the [place] FDA and  has been authorized for detection and/or diagnosis of SARS-CoV-2 by FDA  under an Emergency Use Authorization (EUA). This EUA will remain  in effect (meaning this test can be used) for the duration of the COVID-19 declaration under Section 564(b)(1) of the Act, 21 U.S.C.section 360bbb-3(b)(1), unless the authorization is terminated  or revoked sooner.       Influenza A by PCR NEGATIVE NEGATIVE Final   Influenza B by PCR NEGATIVE NEGATIVE Final    Comment: (NOTE) The Xpert Xpress SARS-CoV-2/FLU/RSV plus assay is intended as an aid in the diagnosis of influenza from Nasopharyngeal swab specimens and should not be used as a sole basis for treatment. Nasal washings and aspirates are unacceptable for Xpert Xpress SARS-CoV-2/FLU/RSV testing.  Fact Sheet for Patients: BloggerCourse.com  Fact Sheet for Healthcare Providers: SeriousBroker.it  This test is not yet approved or cleared by the [place] FDA and has been authorized for detection and/or diagnosis of SARS-CoV-2 by FDA under an Emergency Use Authorization (EUA). This EUA will remain in effect (meaning this test can be used) for the duration of the COVID-19 declaration under Section 564(b)(1) of the Act, 21 U.S.C. section 360bbb-3(b)(1), unless the authorization is terminated or revoked.  Performed at [facility], [address], [place], [place] [address]   Urine culture     Status: Abnormal   Collection Time: 12/20/20  2:02 PM   Specimen: Urine, Random  Result Value Ref Range Status   Specimen Description   Final    URINE, RANDOM Performed at [facility], [address], [place], [place] [address]    Special Requests   Final    NONE Performed at [facility], [address], [place], [place] [address]    Culture (A)  Final    >=100,000 COLONIES/mL ESCHERICHIA COLI Confirmed Extended Spectrum Beta-Lactamase Producer (ESBL).  In bloodstream infections from ESBL organisms,  carbapenems are preferred over piperacillin/tazobactam. They are shown to have a lower risk of mortality.    Report Status 12/23/2020 FINAL  Final   Organism ID, Bacteria ESCHERICHIA COLI (A)  Final      Susceptibility   Escherichia coli - MIC*    AMPICILLIN >=32 RESISTANT Resistant     CEFAZOLIN >=64 RESISTANT Resistant     CEFEPIME 16 RESISTANT Resistant     CEFTRIAXONE >=64 RESISTANT Resistant     CIPROFLOXACIN >=4 RESISTANT Resistant     GENTAMICIN <=1 SENSITIVE Sensitive     IMIPENEM <=0.25 SENSITIVE Sensitive     NITROFURANTOIN <=16 SENSITIVE Sensitive     TRIMETH/SULFA >=320 RESISTANT Resistant     AMPICILLIN/SULBACTAM 8 SENSITIVE Sensitive     PIP/TAZO <=4 SENSITIVE Sensitive     * >=100,000 COLONIES/mL ESCHERICHIA COLI   No urinary sx, asymptomatic bacteruria, discontinue Keflex  ED Provider: Bethann Berkshire, MD   Melchor Amour 12/24/2020, 9:31 AM Student Pharmacist

## 2020-12-24 NOTE — Telephone Encounter (Signed)
Post ED Visit - Positive Culture Follow-up: Successful Patient Follow-Up  Culture assessed and recommendations reviewed by:  []  , Pharm.D. []  Enzo Bi, Pharm.D., BCPS AQ-ID []  , Pharm.D., BCPS []  Celedonio Miyamoto, Pharm.D., BCPS []  [place], Garvin Fila.D., BCPS, AAHIVP []  , Pharm.D., BCPS, AAHIVP []  Georgina Pillion, PharmD, BCPS []  , PharmD, BCPS []  [place], PharmD, BCPS []  [address], PharmD  Positive urine culture  []  Patient discharged without antimicrobial prescription and treatment is now indicated [x]  Organism is resistant to prescribed ED discharge antimicrobial []  Patient with positive blood cultures  Changes discussed with ED provider: [place] New antibiotic prescription d/c keflex d/t resistance, asymptomatic, no further treatment needed  Attempting to contact patient    12/24/2020, 11:16 AM

## 2021-01-08 ENCOUNTER — Encounter: Payer: Self-pay | Admitting: Osteopathic Medicine

## 2021-01-08 ENCOUNTER — Other Ambulatory Visit: Payer: Self-pay

## 2021-01-08 ENCOUNTER — Ambulatory Visit (INDEPENDENT_AMBULATORY_CARE_PROVIDER_SITE_OTHER): Payer: Medicare Other | Admitting: Osteopathic Medicine

## 2021-01-08 VITALS — BP 123/71 | HR 80 | Temp 98.6°F | Wt 172.0 lb

## 2021-01-08 DIAGNOSIS — Z1612 Extended spectrum beta lactamase (ESBL) resistance: Secondary | ICD-10-CM

## 2021-01-08 DIAGNOSIS — F3010 Manic episode without psychotic symptoms, unspecified: Secondary | ICD-10-CM

## 2021-01-08 DIAGNOSIS — I10 Essential (primary) hypertension: Secondary | ICD-10-CM

## 2021-01-08 DIAGNOSIS — N390 Urinary tract infection, site not specified: Secondary | ICD-10-CM

## 2021-01-08 DIAGNOSIS — R609 Edema, unspecified: Secondary | ICD-10-CM

## 2021-01-08 DIAGNOSIS — A499 Bacterial infection, unspecified: Secondary | ICD-10-CM

## 2021-01-08 NOTE — Patient Instructions (Addendum)
Plan:  I'm not sure what might be causing this swelling. We need some more information first from blood and urine analysis. If swelling gets worse, especially if you also have trouble breathing or have chest pain, or if one leg becomes much larger than another, please seek medical care ASAP!   I reviewed your medication list. The only prescriptions you should be on are:  Atorvastatin for cholesterol  Lisinopril-Hydrochlorothiazide for blood pressure - please make sure you are taking the pill with Lisinopril PLUS Hydrochlorothiazide, your hospital paperwork states you were just on Lisinopril. Being off the Hydrochlorothiazide can also cause swelling.   Tamsulosin for urine flow

## 2021-01-09 LAB — COMPLETE METABOLIC PANEL WITH GFR
AG Ratio: 1.3 (calc) (ref 1.0–2.5)
ALT: 22 U/L (ref 9–46)
AST: 22 U/L (ref 10–35)
Albumin: 3.7 g/dL (ref 3.6–5.1)
Alkaline phosphatase (APISO): 61 U/L (ref 35–144)
BUN: 14 mg/dL (ref 7–25)
CO2: 28 mmol/L (ref 20–32)
Calcium: 9.2 mg/dL (ref 8.6–10.3)
Chloride: 107 mmol/L (ref 98–110)
Creat: 1.12 mg/dL (ref 0.70–1.25)
GFR, Est African American: 81 mL/min/{1.73_m2} (ref >=60)
GFR, Est Non African American: 70 mL/min/{1.73_m2} (ref >=60)
Globulin: 2.8 g/dL (calc) (ref 1.9–3.7)
Glucose, Bld: 67 mg/dL (ref 65–99)
Potassium: 4.2 mmol/L (ref 3.5–5.3)
Sodium: 141 mmol/L (ref 135–146)
Total Bilirubin: 0.2 mg/dL (ref 0.2–1.2)
Total Protein: 6.5 g/dL (ref 6.1–8.1)

## 2021-01-09 LAB — CBC WITH DIFFERENTIAL/PLATELET
Absolute Monocytes: 410 cells/uL (ref 200–950)
Basophils Absolute: 46 cells/uL (ref 0–200)
Basophils Relative: 0.5 %
Eosinophils Absolute: 564 cells/uL — ABNORMAL HIGH (ref 15–500)
Eosinophils Relative: 6.2 %
HCT: 35.5 % — ABNORMAL LOW (ref 38.5–50.0)
Hemoglobin: 11.5 g/dL — ABNORMAL LOW (ref 13.2–17.1)
Lymphs Abs: 1556 cells/uL (ref 850–3900)
MCH: 30.7 pg (ref 27.0–33.0)
MCHC: 32.4 g/dL (ref 32.0–36.0)
MCV: 94.7 fL (ref 80.0–100.0)
MPV: 10.2 fL (ref 7.5–12.5)
Monocytes Relative: 4.5 %
Neutro Abs: 6525 cells/uL (ref 1500–7800)
Neutrophils Relative %: 71.7 %
Platelets: 283 10*3/uL (ref 140–400)
RBC: 3.75 10*6/uL — ABNORMAL LOW (ref 4.20–5.80)
RDW: 15.8 % — ABNORMAL HIGH (ref 11.0–15.0)
Total Lymphocyte: 17.1 %
WBC: 9.1 10*3/uL (ref 3.8–10.8)

## 2021-01-09 LAB — TSH: TSH: 0.52 mIU/L (ref 0.40–4.50)

## 2021-01-09 NOTE — Progress Notes (Signed)
Jordan B Kinne Sr. is a 65 y.o. male who presents to  [facility] at [facility]  today, 01/08/21 seeking care for the following:  Follow up hospitalization - unclear history of psychiatric hospitalization, there was apparently a mixup with the medications he was given, was on mood stabilizers which he didn't need, was on lisinopril only instead of combo lisinopril-hct. Has noted lower extremity edema since hospitalization. Hx recurrent UTI, no dysuria, ER visit Rx keflex but this was d/c when Cx results came back, sounds like no abx d/t asymptomatic bacteriuria.   Today pt reports doing well since stopping the mood stabilizers, see scanned documents.   Reports no CP/SOB, no orthopnea. Leg edema is better compared to last week.      ASSESSMENT & PLAN with other pertinent findings:  The primary encounter diagnosis was Recurrent UTI. Diagnoses of Essential hypertension, Manic behavior (HCC), and Edema, unspecified type were also pertinent to this visit.    Patient Instructions  Plan:  I'm not sure what might be causing this swelling. We need some more information first from blood and urine analysis. If swelling gets worse, especially if you also have trouble breathing or have chest pain, or if one leg becomes much larger than another, please seek medical care ASAP!   I reviewed your medication list. The only prescriptions you should be on are:  Atorvastatin for cholesterol  Lisinopril-Hydrochlorothiazide for blood pressure - please make sure you are taking the pill with Lisinopril PLUS Hydrochlorothiazide, your hospital paperwork states you were just on Lisinopril. Being off the Hydrochlorothiazide can also cause swelling.   Tamsulosin for urine flow      Orders Placed This Encounter  Procedures  . Urine Culture  . Urinalysis  . Urine Microscopic  . CBC with Differential/Platelet  . COMPLETE METABOLIC PANEL WITH GFR  . TSH    No  orders of the defined types were placed in this encounter.    See below for relevant physical exam findings  See below for recent lab and imaging results reviewed  Medications, allergies, PMH, PSH, SocH, FamH reviewed below    Follow-up instructions: Return for RECHECK PENDING RESULTS / IF WORSE OR CHANGE.                                        Exam:  BP 123/71 (BP Location: Left Arm, Patient Position: Sitting, Cuff Size: Normal)   Pulse 80   Temp 98.6 F (37 C) (Oral)   Wt 172 lb 0.6 oz (78 kg)   BMI 22.09 kg/m   Constitutional: VS see above. General Appearance: alert, well-developed, well-nourished, NAD  Neck: No masses, trachea midline.   Respiratory: Normal respiratory effort. no wheeze, no rhonchi, no rales  Cardiovascular: S1/S2 normal, no murmur, no rub/gallop auscultated. RRR. (+)2 LE edema to just proximal to ankles and (+)1 above that to shins  Musculoskeletal: Gait normal. Symmetric and independent movement of all extremities  Abdominal: non-tender, non-distended, no appreciable organomegaly, neg Murphy's, BS WNLx4  Neurological: Normal balance/coordination. No tremor.  Skin: warm, dry, intact.   Psychiatric: Fair judgment/insight. Pressured speech, pleasant mood/affect. Oriented x3.   Current Meds  Medication Sig  . atorvastatin (LIPITOR) 40 MG tablet Take 1 tablet (40 mg total) by mouth daily.  Marland Kitchen ibuprofen (ADVIL,MOTRIN) 200 MG tablet Take 200 mg by mouth every 6 (six) hours as needed for fever,  headache or mild pain.  Marland Kitchen lisinopril-hydrochlorothiazide (ZESTORETIC) 20-12.5 MG tablet Take 1 tablet by mouth daily.  . Multiple Vitamin (MULTIVITAMIN WITH MINERALS) TABS tablet Take 1 tablet by mouth daily.  . tamsulosin (FLOMAX) 0.4 MG CAPS capsule Take 1 capsule (0.4 mg total) by mouth daily.    No Known Allergies  Patient Active Problem List   Diagnosis Date Noted  . Altered mental status, unspecified 12/21/2020  .  Urinary tract infection 12/21/2020  . Manic behavior (HCC)   . HLD (hyperlipidemia) 06/15/2018  . Smoker 11/04/2016  . LVH (left ventricular hypertrophy) 05/01/2016  . Diastolic dysfunction 05/01/2016  . Shortened PR interval 04/22/2016  . BPH (benign prostatic hyperplasia) 04/21/2016  . HTN (hypertension) 04/21/2016    Family History  Problem Relation Age of Onset  . Diabetes Mother   . Hypertension Mother   . Hypertension Father     Social History   Tobacco Use  Smoking Status Current Some Day Smoker  . Types: Cigars  Smokeless Tobacco Current User    Past Surgical History:  Procedure Laterality Date  . Arm surgery    . arthroscopic knee surgery      Immunization History  Administered Date(s) Administered  . Influenza,inj,Quad PF,6+ Mos 04/21/2016, 06/24/2017, 06/15/2018, 04/19/2019, 07/02/2020  . PFIZER(Purple Top)SARS-COV-2 Vaccination 10/25/2019, 11/15/2019, 05/11/2020  . Tdap 04/21/2016  . Zoster Recombinat (Shingrix) 08/29/2019    Recent Results (from the past 2160 hour(s))  Resp Panel by RT-PCR (Flu A&B, Covid) Nasopharyngeal Swab     Status: None   Collection Time: 12/19/20  3:13 PM   Specimen: Nasopharyngeal Swab; Nasopharyngeal(NP) swabs in vial transport medium  Result Value Ref Range   SARS Coronavirus 2 by RT PCR NEGATIVE NEGATIVE    Comment: (NOTE) SARS-CoV-2 target nucleic acids are NOT DETECTED.  The SARS-CoV-2 RNA is generally detectable in upper respiratory specimens during the acute phase of infection. The lowest concentration of SARS-CoV-2 viral copies this assay can detect is 138 copies/mL. A negative result does not preclude SARS-Cov-2 infection and should not be used as the sole basis for treatment or other patient management decisions. A negative result may occur with  improper specimen collection/handling, submission of specimen other than nasopharyngeal swab, presence of viral mutation(s) within the areas targeted by this assay, and  inadequate number of viral copies(<138 copies/mL). A negative result must be combined with clinical observations, patient history, and epidemiological information. The expected result is Negative.  Fact Sheet for Patients:  BloggerCourse.com  Fact Sheet for Healthcare Providers:  SeriousBroker.it  This test is no t yet approved or cleared by the [place] FDA and  has been authorized for detection and/or diagnosis of SARS-CoV-2 by FDA under an Emergency Use Authorization (EUA). This EUA will remain  in effect (meaning this test can be used) for the duration of the COVID-19 declaration under Section 564(b)(1) of the Act, 21 U.S.C.section 360bbb-3(b)(1), unless the authorization is terminated  or revoked sooner.       Influenza A by PCR NEGATIVE NEGATIVE   Influenza B by PCR NEGATIVE NEGATIVE    Comment: (NOTE) The Xpert Xpress SARS-CoV-2/FLU/RSV plus assay is intended as an aid in the diagnosis of influenza from Nasopharyngeal swab specimens and should not be used as a sole basis for treatment. Nasal washings and aspirates are unacceptable for Xpert Xpress SARS-CoV-2/FLU/RSV testing.  Fact Sheet for Patients: BloggerCourse.com  Fact Sheet for Healthcare Providers: SeriousBroker.it  This test is not yet approved or cleared by the [place] FDA and has  been authorized for detection and/or diagnosis of SARS-CoV-2 by FDA under an Emergency Use Authorization (EUA). This EUA will remain in effect (meaning this test can be used) for the duration of the COVID-19 declaration under Section 564(b)(1) of the Act, 21 U.S.C. section 360bbb-3(b)(1), unless the authorization is terminated or revoked.  Performed at [facility], [address], [place], [place] [address]   Comprehensive metabolic panel     Status: Abnormal   Collection Time: 12/19/20  4:38 PM   Result Value Ref Range   Sodium 137 135 - 145 mmol/L   Potassium 4.4 3.5 - 5.1 mmol/L   Chloride 105 98 - 111 mmol/L   CO2 28 22 - 32 mmol/L   Glucose, Bld 88 70 - 99 mg/dL    Comment: Glucose reference range applies only to samples taken after fasting for at least 8 hours.   BUN 19 8 - 23 mg/dL   Creatinine, Ser 4.62 0.61 - 1.24 mg/dL   Calcium 8.9 8.9 - 70.3 mg/dL   Total Protein 6.2 (L) 6.5 - 8.1 g/dL   Albumin 3.1 (L) 3.5 - 5.0 g/dL   AST 25 15 - 41 U/L   ALT 25 0 - 44 U/L   Alkaline Phosphatase 35 (L) 38 - 126 U/L   Total Bilirubin 0.2 (L) 0.3 - 1.2 mg/dL   GFR, Estimated >50 >09 mL/min    Comment: (NOTE) Calculated using the CKD-EPI Creatinine Equation (2021)    Anion gap 4 (L) 5 - 15    Comment: Performed at [facility], [address] [place], [place] [address]  Ethanol     Status: None   Collection Time: 12/19/20  4:38 PM  Result Value Ref Range   Alcohol, Ethyl (B) <10 <10 mg/dL    Comment: (NOTE) Lowest detectable limit for serum alcohol is 10 mg/dL.  For medical purposes only. Performed at [facility], [address] [place], [place] [address]   CBC with Diff     Status: Abnormal   Collection Time: 12/19/20  4:38 PM  Result Value Ref Range   WBC 8.1 4.0 - 10.5 K/uL   RBC 2.90 (L) 4.22 - 5.81 MIL/uL   Hemoglobin 9.1 (L) 13.0 - 17.0 g/dL   HCT 69.6 (L) 78.9 - 38.1 %   MCV 94.1 80.0 - 100.0 fL   MCH 31.4 26.0 - 34.0 pg   MCHC 33.3 30.0 - 36.0 g/dL   RDW 01.7 51.0 - 25.8 %   Platelets 200 150 - 400 K/uL   nRBC 0.0 0.0 - 0.2 %   Neutrophils Relative % 78 %   Neutro Abs 6.3 1.7 - 7.7 K/uL   Lymphocytes Relative 17 %   Lymphs Abs 1.4 0.7 - 4.0 K/uL   Monocytes Relative 5 %   Monocytes Absolute 0.4 0.1 - 1.0 K/uL   Eosinophils Relative 0 %   Eosinophils Absolute 0.0 0.0 - 0.5 K/uL   Basophils Relative 0 %   Basophils Absolute 0.0 0.0 - 0.1 K/uL   Immature Granulocytes 0 %   Abs Immature Granulocytes 0.02 0.00 -  0.07 K/uL    Comment: Performed at [facility], [address] [place], [place] [address]  Salicylate level     Status: Abnormal   Collection Time: 12/19/20  4:38 PM  Result Value Ref Range   Salicylate Lvl <7.0 (L) 7.0 - 30.0 mg/dL    Comment: Performed at [facility], [address] [place], [place] [address]  Acetaminophen level     Status: Abnormal   Collection Time: 12/19/20  4:38 PM  Result Value Ref Range   Acetaminophen (Tylenol), Serum <10 (L) 10 - 30 ug/mL    Comment: (NOTE) Therapeutic concentrations vary significantly. A range of 10-30 ug/mL  may be an effective concentration for many patients. However, some  are best treated at concentrations outside of this range. Acetaminophen concentrations >150 ug/mL at 4 hours after ingestion  and >50 ug/mL at 12 hours after ingestion are often associated with  toxic reactions.  Performed at [facility], [address], [place], [place] [address]   CK     Status: None   Collection Time: 12/19/20  4:38 PM  Result Value Ref Range   Total CK 211 49 - 397 U/L    Comment: Performed at [facility], [address], [place], [place] [address]  CBC     Status: Abnormal   Collection Time: 12/19/20  8:46 PM  Result Value Ref Range   WBC 9.6 4.0 - 10.5 K/uL   RBC 3.08 (L) 4.22 - 5.81 MIL/uL   Hemoglobin 9.6 (L) 13.0 - 17.0 g/dL   HCT 53.2 (L) 99.2 - 42.6 %   MCV 92.5 80.0 - 100.0 fL   MCH 31.2 26.0 - 34.0 pg   MCHC 33.7 30.0 - 36.0 g/dL   RDW 83.4 19.6 - 22.2 %   Platelets 223 150 - 400 K/uL   nRBC 0.0 0.0 - 0.2 %    Comment: Performed at [facility], [address], [place], [place] [address]  Urine rapid drug screen (hosp performed)     Status: Abnormal   Collection Time: 12/20/20 11:49 AM  Result Value Ref Range   Opiates NONE DETECTED NONE DETECTED   Cocaine NONE DETECTED NONE DETECTED   Benzodiazepines NONE DETECTED NONE  DETECTED   Amphetamines NONE DETECTED NONE DETECTED   Tetrahydrocannabinol POSITIVE (A) NONE DETECTED   Barbiturates NONE DETECTED NONE DETECTED    Comment: (NOTE) DRUG SCREEN FOR MEDICAL PURPOSES ONLY.  IF CONFIRMATION IS NEEDED FOR ANY PURPOSE, NOTIFY LAB WITHIN 5 DAYS.  LOWEST DETECTABLE LIMITS FOR URINE DRUG SCREEN Drug Class                     Cutoff (ng/mL) Amphetamine and metabolites    1000 Barbiturate and metabolites    200 Benzodiazepine                 200 Tricyclics and metabolites     300 Opiates and metabolites        300 Cocaine and metabolites        300 THC                            50 Performed at [facility], [address], [place], [place] [address]   Urinalysis, Routine w reflex microscopic     Status: Abnormal   Collection Time: 12/20/20 11:49 AM  Result Value Ref Range   Color, Urine YELLOW YELLOW   APPearance HAZY (A) CLEAR   Specific Gravity, Urine 1.012 1.005 - 1.030   pH 7.0 5.0 - 8.0   Glucose, UA NEGATIVE NEGATIVE mg/dL   Hgb urine dipstick NEGATIVE NEGATIVE   Bilirubin Urine NEGATIVE NEGATIVE   Ketones, ur NEGATIVE NEGATIVE mg/dL   Protein, ur NEGATIVE NEGATIVE mg/dL   Nitrite POSITIVE (A)  NEGATIVE   Leukocytes,Ua MODERATE (A) NEGATIVE   RBC / HPF 0-5 0 - 5 RBC/hpf   WBC, UA 21-50 0 - 5 WBC/hpf   Bacteria, UA MANY (A) NONE SEEN   Mucus PRESENT     Comment: Performed at [facility] [address] [place], [place] [address]  Urine culture     Status: Abnormal   Collection Time: 12/20/20  2:02 PM   Specimen: Urine, Random  Result Value Ref Range   Specimen Description      URINE, RANDOM Performed at [facility], [address] [place], [place] [address]    Special Requests      NONE Performed at [facility], [address], [place], [place] [address]    Culture (A)     >=100,000 COLONIES/mL ESCHERICHIA COLI Confirmed Extended Spectrum Beta-Lactamase  Producer (ESBL).  In bloodstream infections from ESBL organisms, carbapenems are preferred over piperacillin/tazobactam. They are shown to have a lower risk of mortality.    Report Status 12/23/2020 FINAL    Organism ID, Bacteria ESCHERICHIA COLI (A)       Susceptibility   Escherichia coli - MIC*    AMPICILLIN >=32 RESISTANT Resistant     CEFAZOLIN >=64 RESISTANT Resistant     CEFEPIME 16 RESISTANT Resistant     CEFTRIAXONE >=64 RESISTANT Resistant     CIPROFLOXACIN >=4 RESISTANT Resistant     GENTAMICIN <=1 SENSITIVE Sensitive     IMIPENEM <=0.25 SENSITIVE Sensitive     NITROFURANTOIN <=16 SENSITIVE Sensitive     TRIMETH/SULFA >=320 RESISTANT Resistant     AMPICILLIN/SULBACTAM 8 SENSITIVE Sensitive     PIP/TAZO <=4 SENSITIVE Sensitive     * >=100,000 COLONIES/mL ESCHERICHIA COLI  CBC with Differential/Platelet     Status: Abnormal   Collection Time: 01/08/21 12:00 AM  Result Value Ref Range   WBC 9.1 3.8 - 10.8 Thousand/uL   RBC 3.75 (L) 4.20 - 5.80 Million/uL   Hemoglobin 11.5 (L) 13.2 - 17.1 g/dL   HCT 08.6 (L) 57.8 - 46.9 %   MCV 94.7 80.0 - 100.0 fL   MCH 30.7 27.0 - 33.0 pg   MCHC 32.4 32.0 - 36.0 g/dL   RDW 62.9 (H) 52.8 - 41.3 %   Platelets 283 140 - 400 Thousand/uL   MPV 10.2 7.5 - 12.5 fL   Neutro Abs 6,525 1,500 - 7,800 cells/uL   Lymphs Abs 1,556 850 - 3,900 cells/uL   Absolute Monocytes 410 200 - 950 cells/uL   Eosinophils Absolute 564 (H) 15 - 500 cells/uL   Basophils Absolute 46 0 - 200 cells/uL   Neutrophils Relative % 71.7 %   Total Lymphocyte 17.1 %   Monocytes Relative 4.5 %   Eosinophils Relative 6.2 %   Basophils Relative 0.5 %  COMPLETE METABOLIC PANEL WITH GFR     Status: None   Collection Time: 01/08/21 12:00 AM  Result Value Ref Range   Glucose, Bld 67 65 - 99 mg/dL    Comment: .            Fasting reference interval .    BUN 14 7 - 25 mg/dL   Creat 2.44 0.10 - 2.72 mg/dL    Comment: For patients >58 years of age, the reference limit for  Creatinine is approximately 13% higher for people identified as African-American. .    GFR, Est Non African American 70 > OR = 60 mL/min/1.68m2   GFR, Est African American 81 > OR =  60 mL/min/1.3673m2   BUN/Creatinine Ratio NOT APPLICABLE 6 - 22 (calc)   Sodium 141 135 - 146 mmol/L   Potassium 4.2 3.5 - 5.3 mmol/L   Chloride 107 98 - 110 mmol/L   CO2 28 20 - 32 mmol/L   Calcium 9.2 8.6 - 10.3 mg/dL   Total Protein 6.5 6.1 - 8.1 g/dL   Albumin 3.7 3.6 - 5.1 g/dL   Globulin 2.8 1.9 - 3.7 g/dL (calc)   AG Ratio 1.3 1.0 - 2.5 (calc)   Total Bilirubin 0.2 0.2 - 1.2 mg/dL   Alkaline phosphatase (APISO) 61 35 - 144 U/L   AST 22 10 - 35 U/L   ALT 22 9 - 46 U/L  TSH     Status: None   Collection Time: 01/08/21 12:00 AM  Result Value Ref Range   TSH 0.52 0.40 - 4.50 mIU/L  Urinalysis     Status: Abnormal   Collection Time: 01/08/21 11:32 AM  Result Value Ref Range   Color, Urine YELLOW YELLOW   APPearance CLOUDY (A) CLEAR   Specific Gravity, Urine 1.021 1.001 - 1.035   pH 6.5 5.0 - 8.0   Glucose, UA NEGATIVE NEGATIVE   Bilirubin Urine NEGATIVE NEGATIVE   Ketones, ur NEGATIVE NEGATIVE   Hgb urine dipstick NEGATIVE NEGATIVE   Protein, ur NEGATIVE NEGATIVE   Nitrite POSITIVE (A) NEGATIVE   Leukocytes,Ua 2+ (A) NEGATIVE  Urine Microscopic     Status: Abnormal   Collection Time: 01/08/21 11:32 AM  Result Value Ref Range   WBC, UA 6-10 (A) 0 - 5 /HPF   RBC / HPF NONE SEEN 0 - 2 /HPF   Squamous Epithelial / LPF NONE SEEN < OR = 5 /HPF   Bacteria, UA MANY (A) NONE SEEN /HPF   Hyaline Cast NONE SEEN NONE SEEN /LPF   Note      Comment: This urine was analyzed for the presence of WBC,  RBC, bacteria, casts, and other formed elements.  Only those elements seen were reported. . .     No results found.     All questions at time of visit were answered - patient instructed to contact office with any additional concerns or updates. ER/RTC precautions were reviewed with the patient as  applicable.   Please note: manual typing as well as voice recognition software may have been used to produce this document - typos may escape review. Please contact Dr. Lyn HollingsheadAlexander for any needed clarifications.   Total encounter time on date of service, 01/08/21, was 40 minutes spent addressing problems/issues as noted above in Assessment & Plan, including time spent in discussion with patient regarding the HPI, ROS, confirming history, reviewing Assessment & Plan, as well as time spent on coordination of care, record review.

## 2021-01-10 LAB — URINE CULTURE
MICRO NUMBER:: 11954195
SPECIMEN QUALITY:: ADEQUATE

## 2021-01-10 LAB — URINALYSIS
Bilirubin Urine: NEGATIVE
Glucose, UA: NEGATIVE
Hgb urine dipstick: NEGATIVE
Ketones, ur: NEGATIVE
Nitrite: POSITIVE — AB
Protein, ur: NEGATIVE
Specific Gravity, Urine: 1.021 (ref 1.001–1.035)
pH: 6.5 (ref 5.0–8.0)

## 2021-01-10 LAB — URINALYSIS, MICROSCOPIC ONLY
Hyaline Cast: NONE SEEN /LPF
RBC / HPF: NONE SEEN /HPF (ref 0–2)
Squamous Epithelial / HPF: NONE SEEN /HPF (ref <=5)

## 2021-01-10 MED ORDER — NITROFURANTOIN MONOHYD MACRO 100 MG PO CAPS: 100.0000 mg | ORAL_CAPSULE | Freq: Two times a day (BID) | ORAL | 0 refills | Status: DC

## 2021-01-10 NOTE — Addendum Note (Signed)
Addended by: Deirdre Pippins on: 01/10/2021 01:27 PM   Modules accepted: Orders

## 2021-02-12 ENCOUNTER — Encounter (HOSPITAL_COMMUNITY): Payer: Self-pay | Admitting: Psychiatry

## 2021-02-12 ENCOUNTER — Ambulatory Visit (INDEPENDENT_AMBULATORY_CARE_PROVIDER_SITE_OTHER): Payer: Medicare Other | Admitting: Psychiatry

## 2021-02-12 VITALS — BP 132/78 | HR 99 | Temp 97.8°F | Ht 74.0 in | Wt 164.6 lb

## 2021-02-12 DIAGNOSIS — Z658 Other specified problems related to psychosocial circumstances: Secondary | ICD-10-CM | POA: Diagnosis not present

## 2021-02-12 DIAGNOSIS — R404 Transient alteration of awareness: Secondary | ICD-10-CM | POA: Diagnosis not present

## 2021-02-12 NOTE — Progress Notes (Signed)
Psychiatric Initial Adult Assessment   Patient Identification: Jordan B Copher Sr. MRN:  [id number] Date of Evaluation:  02/12/2021 Referral Source: hospital discharge Chief Complaint:  i dont need med, am all good Visit Diagnosis:    ICD-10-CM   1. Psychosocial stressors  Z65.8     2. Transient alteration of awareness  R40.4       History of Present Illness: Patient is a 65 years old currently married African-American male referred from hospital discharge diagnosis altered mental status at that time Retired from [employer], married AA lives with wife  Charts and notes reviewed from the hospital he was psychiatrically clear apparently he has been [facility] and then readmitted again at emergency services for altered mental status and was diagnosed with UTI which was cleared and he was discharged.  He was having stressors at home he took a break from the family and wanted to go out there was some confusion according to him they thought that I was mad and not doing well depressed and down and found him to be somewhat confused and hospitalized in [facility] for 2 weeks.  Later on was in hospital in [place] with altered mental status and psychiatrically cleared considering there was a UTI He was given psych medication including olanzapine and as needed medication while he was in the hospital in [place]  there i was also some leg swelling and his medication adjusted for his blood pressure he did reasonable and  he was medically cleared   He does endorse using marijuana but says it is sporadic he also endorses using alcohol on the weekends but denies heavy or excessive use  He states his stress related to wife who is trying to take care of 2 grandkids she has arthritis and she should take we will consider knee surgery.  Stressors are at home are manageable states that I am back where I am baseline I am a talkative person  At the time of admission and reviewing hospital notes he was  agitated hypertalkative and confused diagnosed with altered mental status  There is no prior psychiatric history of admission there is no prior psychiatric history of psychosis  He denies depression or depressive symptoms he states he is trying to play golf try to keep himself busy occupied with kids and activities  Does not endorse excessive worries he feels the psychosocial stressors are related to wife and finances but is not excessively bothering him and he is able to manage it  His wife told him today when he was coming does even need this appointment because he is at baseline  He describes himself.  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    Consequences of Substance Abuse: Discussed effect of THC on judjement, confusion, paranoia and depression  Past Medical History:  Past Medical History:  Diagnosis Date   HLD (hyperlipidemia) 06/15/2018   Hypertension     Past Surgical History:  Procedure Laterality Date   Arm surgery  arthroscopic knee surgery      Family Psychiatric History: denies  Family History:  Family History  Problem Relation Age of Onset   Diabetes Mother    Hypertension Mother    Hypertension Father     Social History:   Social History   Socioeconomic History   Marital status: Married    Spouse name: Not on file   Number of children: 3   Years of education: Not on file   Highest education level: Not on file  Occupational History     Comment: Retired  Tobacco Use   Smoking status: Some Days    Pack years: 0.00    Types: Cigars   Smokeless tobacco: Current   Tobacco comments:    Occasional use of cigars  Substance and Sexual Activity   Alcohol use: Yes    Alcohol/week: 1.0 standard drink    Types: 1 Cans of beer per week    Comment: Occasional   Drug use: No   Sexual activity: Not Currently  Other Topics Concern   Not on file  Social History Narrative   Not on file   Social Determinants of Health   Financial Resource Strain: Not on file  Food Insecurity: Not on file  Transportation Needs: Not on file  Physical Activity: Not on file  Stress: Not on file  Social Connections: Not on file    Additional Social History: grew up with siblings and parents, denies any particular trauma  Allergies:  No Known Allergies  Metabolic Disorder Labs: Lab Results  Component Value Date   HGBA1C 5.2 04/21/2016   MPG 103 04/21/2016   No results found for: PROLACTIN Lab Results  Component Value Date   CHOL 111 09/28/2020   TRIG 94 09/28/2020   HDL 65 09/28/2020   CHOLHDL 1.7 09/28/2020   VLDL 18 04/21/2016   LDLCALC 28 09/28/2020   LDLCALC 53 08/31/2019   Lab Results  Component Value Date   TSH 0.52 01/08/2021    Therapeutic Level Labs: No results found for: LITHIUM No results found for: CBMZ No results found for: VALPROATE  Current Medications: Current Outpatient Medications  Medication Sig Dispense Refill   atorvastatin (LIPITOR) 40 MG tablet Take 1 tablet (40 mg total) by mouth daily. 90 tablet 3   lisinopril-hydrochlorothiazide (ZESTORETIC) 20-12.5 MG tablet Take 1 tablet by mouth daily. 90 tablet 3   Multiple Vitamin (MULTIVITAMIN WITH MINERALS) TABS tablet Take 1 tablet by mouth daily.     tamsulosin (FLOMAX) 0.4 MG CAPS capsule Take 1 capsule (0.4 mg total) by mouth daily. 90 capsule 3   ibuprofen (ADVIL,MOTRIN) 200 MG tablet Take 200 mg by mouth every 6 (six) hours as needed for fever,  headache or mild pain. (Patient not taking: Reported on 02/12/2021)     nitrofurantoin, macrocrystal-monohydrate, (MACROBID) 100 MG capsule Take 1 capsule (100 mg total) by mouth 2 (two) times daily. (Patient not taking: Reported on 02/12/2021) 20 capsule 0   No current facility-administered medications for this visit.     Psychiatric Specialty Exam: Review of Systems  Cardiovascular:  Negative for chest pain.  Psychiatric/Behavioral:  Negative for agitation and self-injury.    Blood pressure 132/78, pulse 99, temperature 97.8 F (36.6 C), height 6\' 2"  (1.88 m), weight 164 lb 9.6 oz (74.7 kg), SpO2 99 %.Body mass index is 21.13 kg/m.  General Appearance: Casual  Eye Contact:  Fair  Speech:  Normal Rate  Volume:  Normal  Mood:  Euthymic  Affect:  Full Range  Thought Process:  Coherent  Orientation:  Full (Time, Place, and Person)  Thought Content:  Rumination  Suicidal Thoughts:  No  Homicidal Thoughts:  No  Memory:  Immediate;   Fair Recent;   Fair  Judgement:  Fair  Insight:  Shallow  Psychomotor Activity:  Normal  Concentration:  Attention Span: Fair  Recall:  [employer] of Knowledge:Fair  Language: Fair  Akathisia:  No  Handed:   AIMS (if indicated):  not done  Assets:  Desire for Improvement Social Support  ADL's:  Intact  Cognition: WNL  Sleep:  Fair   Screenings: GAD-7    Flowsheet Row Office Visit from 08/29/2019 in [facility]  Total GAD-7 Score 11      PHQ2-9    Flowsheet Row Office Visit from [facility]12/2021 [facility] [place] Office Visit from 09/28/2020 in [facility] Office Visit from 08/29/2019 in [facility] Office Visit from 07/15/2018 in [facility] Office Visit from 06/24/2017 in [facility]  PHQ-2 Total Score 0 0 3 0 0  PHQ-9 Total Score -- -- 7  -- --      Flowsheet Row Office Visit from 02/12/2021 in [facility] [place] ED from 12/19/2020 in [facility]  C-SSRS RISK CATEGORY No Risk No Risk       Assessment and Plan: as follows  Altered mental statuus: recovered or now at baseline, wife told him why is he going to today appointment since doing better UTI was treated and has followed with primary care Understands the effect of THC, dehydration, alcohol on mental status as well  Anixety unspecified: psychosocial stressor  Stress is related to wife, who spends money, has kne condition but still has burden to take care of grand kids , Provided therapy and consider counselling if neededs, he doesn't feel he needs to be in counselling or medications and states it was all misunderstanding  Total face to face time spent along with charting: 55 min FU 2 m or earlier fi needed   Thresa Ross, MD 7/5/202211:32 AM

## 2021-02-13 ENCOUNTER — Other Ambulatory Visit: Payer: Self-pay

## 2021-02-13 ENCOUNTER — Ambulatory Visit (INDEPENDENT_AMBULATORY_CARE_PROVIDER_SITE_OTHER): Payer: Medicare Other | Admitting: Infectious Disease

## 2021-02-13 VITALS — BP 118/81 | HR 89 | Temp 98.1°F | Resp 16 | Ht 74.0 in | Wt 164.6 lb

## 2021-02-13 DIAGNOSIS — R350 Frequency of micturition: Secondary | ICD-10-CM

## 2021-02-13 DIAGNOSIS — N401 Enlarged prostate with lower urinary tract symptoms: Secondary | ICD-10-CM

## 2021-02-13 DIAGNOSIS — I517 Cardiomegaly: Secondary | ICD-10-CM | POA: Diagnosis not present

## 2021-02-13 DIAGNOSIS — F3010 Manic episode without psychotic symptoms, unspecified: Secondary | ICD-10-CM

## 2021-02-13 DIAGNOSIS — N390 Urinary tract infection, site not specified: Secondary | ICD-10-CM | POA: Diagnosis not present

## 2021-02-13 NOTE — Progress Notes (Signed)
Subjective:  Reason for infectious disease consult: Diagnosis of recurrent urinary tract infection with recurrent colonization of the urine rate more recently with ESBL  Requesting physician Sunnie Nielsen   Patient ID: Jordan Span Humiston Sr., male    DOB: October 20, 1955, 65 y.o.   MRN: [id number]  HPI  Is a 65 year old black man who tells me he was healthy and without medical providers for most of his life only seeing the you [facility] MD for annual visits, but then developed problems with hypertension and benign prostatic hypertrophy with symptoms of frequent urination.  He does have comorbid bipolar disorder and was recently seen in the ER for manic behavior.  He has been treated with antibiotics over the years for symptoms that largely sound to be polyuria.  He does not recall dysuria on most occasions and has never had serious symptoms of flank pain or fever.  More recently he was seen by primary care in May with pyuria and polyuria but no dysuria and prescribed Keflex.  His cultures grew ESBL.  He was evaluated by our pharmacy team when he was seen in the ER for involuntary commitment at the time had asymptomatic bacteriuria and was not prescribed further antibiotics.  On May 31 he had urine analyzed again and again found to have pyuria and found to have ESBL on culture.  Referral was made to [place] with infectious disease.  Been attempts to refer him to urology as well.  I really do not find much in his history that is compelling for clinical evidence of a urinary tract infection.  I do find evidence of evolving resistant to multiple antimicrobials with ESBL now becoming a chronic colonizer of his urine.  I would recommend extreme restraint with regards to antimicrobials in this gentleman.  I would certainly not initiate antibiotics for polyuria alone but would want him to him have more significant symptoms.  I think if we can withhold giving him antibiotics over and over again we will give  a chance that his urine may become colonized with less resistant bacteria that will be easier to treat when he does have evidence of a serious urinary tract infection.     Past Medical History:  Diagnosis Date   HLD (hyperlipidemia) 06/15/2018   Hypertension     Past Surgical History:  Procedure Laterality Date   Arm surgery     arthroscopic knee surgery      Family History  Problem Relation Age of Onset   Diabetes Mother    Hypertension Mother    Hypertension Father       Social History   Socioeconomic History   Marital status: Married    Spouse name: Not on file   Number of children: 3   Years of education: Not on file   Highest education level: Not on file  Occupational History    Comment: Retired  Tobacco Use   Smoking status: Some Days    Pack years: 0.00    Types: Cigars   Smokeless tobacco: Current   Tobacco comments:    Occasional use of cigars  Substance and Sexual Activity   Alcohol use: Yes    Alcohol/week: 1.0 standard drink    Types: 1 Cans of beer per week    Comment: Occasional   Drug use: No   Sexual activity: Not Currently  Other Topics Concern   Not on file  Social History Narrative   Not on file   Social Determinants of Health   Financial Resource  Strain: Not on file  Food Insecurity: Not on file  Transportation Needs: Not on file  Physical Activity: Not on file  Stress: Not on file  Social Connections: Not on file    No Known Allergies   Current Outpatient Medications:    atorvastatin (LIPITOR) 40 MG tablet, Take 1 tablet (40 mg total) by mouth daily., Disp: 90 tablet, Rfl: 3   ibuprofen (ADVIL,MOTRIN) 200 MG tablet, Take 200 mg by mouth every 6 (six) hours as needed for fever, headache or mild pain., Disp: , Rfl:    lisinopril-hydrochlorothiazide (ZESTORETIC) 20-12.5 MG tablet, Take 1 tablet by mouth daily., Disp: 90 tablet, Rfl: 3   Multiple Vitamin (MULTIVITAMIN WITH MINERALS) TABS tablet, Take 1 tablet by mouth daily.,  Disp: , Rfl:    tamsulosin (FLOMAX) 0.4 MG CAPS capsule, Take 1 capsule (0.4 mg total) by mouth daily., Disp: 90 capsule, Rfl: 3   nitrofurantoin, macrocrystal-monohydrate, (MACROBID) 100 MG capsule, Take 1 capsule (100 mg total) by mouth 2 (two) times daily. (Patient not taking: No sig reported), Disp: 20 capsule, Rfl: 0    Review of Systems  Constitutional:  Negative for chills and fever.  HENT:  Negative for congestion and sore throat.   Eyes:  Negative for photophobia.  Respiratory:  Negative for cough, shortness of breath and wheezing.   Cardiovascular:  Negative for chest pain, palpitations and leg swelling.  Gastrointestinal:  Negative for abdominal pain, blood in stool, constipation, diarrhea, nausea and vomiting.  Endocrine: Negative for cold intolerance, heat intolerance, polydipsia, polyphagia and polyuria.  Genitourinary:  Negative for dysuria, flank pain, hematuria, penile discharge, penile pain and scrotal swelling.  Musculoskeletal:  Negative for back pain and myalgias.  Skin:  Negative for color change, rash and wound.  Neurological:  Negative for dizziness, weakness, light-headedness and headaches.  Hematological:  Does not bruise/bleed easily.  Psychiatric/Behavioral:  Negative for behavioral problems, confusion, decreased concentration, dysphoric mood, hallucinations, self-injury, sleep disturbance and suicidal ideas. The patient is not hyperactive.       Objective:   Physical Exam Constitutional:      Appearance: He is well-developed.  HENT:     Head: Normocephalic and atraumatic.  Eyes:     Conjunctiva/sclera: Conjunctivae normal.  Cardiovascular:     Rate and Rhythm: Normal rate and regular rhythm.  Pulmonary:     Effort: Pulmonary effort is normal. No respiratory distress.     Breath sounds: No wheezing.  Abdominal:     General: There is no distension.     Palpations: Abdomen is soft.  Musculoskeletal:        General: No tenderness. Normal range of  motion.     Cervical back: Normal range of motion and neck supple.  Skin:    General: Skin is warm and dry.     Coloration: Skin is not jaundiced or pale.     Findings: No bruising, erythema, lesion or rash.  Neurological:     General: No focal deficit present.     Mental Status: He is alert and oriented to person, place, and time. Mental status is at baseline.  Psychiatric:        Mood and Affect: Mood normal.        Behavior: Behavior normal.        Thought Content: Thought content normal.        Judgment: Judgment normal.          Assessment & Plan:   Colonization with ESBL: Again it is paramount that we  exercise extreme restraint with regards antibiotics to give his urine a chance to become colonized with a different bacteria than the ESBL that is been showing up over and over again in his urine.  Polyuria: Multifactorial.  Some of this may be due to his BPH some of it may also be due to the fact that he thinks that he can improve symptoms of polyuria by drinking more fluids which in fact will make him pee more.  Again I am not convinced that he has been having urinary tract infection when he is been having episodes of more frequent urination alone.  " Recurrent urinary tract infections: I really think the majority of these are not infections but rather ongoing colonization of his urine.  This is not surprising given the fact that he has BPH and is at risk for having colonization of the urine.  BPH: Would encourage him to see a urologist.  Bipolar disorder: he claims is stable now and demised the recent issues when he came to the ER.  He has been hospitalized in the past.  Prevention: He has had screening for HIV and hepatitis C in the past.  He says that he has not been sexually active for several years since he has been suffering from problems with BPH.  He is married and monogamous relationship he believes.  I spent greater than 60 minutes with the patient including  review of the medical records.  Review of relevant radiographic data microbiological and laboratory data in preparation for the visit, in face to face counsel of the patient and in coordination of his care.

## 2021-04-23 ENCOUNTER — Ambulatory Visit (HOSPITAL_COMMUNITY): Payer: Medicare Other | Admitting: Psychiatry

## 2021-07-06 ENCOUNTER — Other Ambulatory Visit: Payer: Self-pay | Admitting: Osteopathic Medicine

## 2021-09-25 ENCOUNTER — Telehealth: Payer: Self-pay | Admitting: Osteopathic Medicine

## 2021-09-25 NOTE — Telephone Encounter (Signed)
Pt called and requested a refill for his BP meds Lisinoppril. He has scheduled a transfer of care appt with Dr. Zigmund Daniel for 4/14.

## 2021-09-26 MED ORDER — LISINOPRIL-HYDROCHLOROTHIAZIDE 20-12.5 MG PO TABS: 1.0000 | ORAL_TABLET | Freq: Every day | ORAL | 0 refills | Status: DC

## 2021-09-26 MED ORDER — TAMSULOSIN HCL 0.4 MG PO CAPS: 0.4000 mg | ORAL_CAPSULE | Freq: Every day | ORAL | 0 refills | Status: DC

## 2021-09-26 MED ORDER — ATORVASTATIN CALCIUM 40 MG PO TABS: 40.0000 mg | ORAL_TABLET | Freq: Every day | ORAL | 0 refills | Status: DC

## 2021-09-26 NOTE — Telephone Encounter (Signed)
Patient advised.

## 2021-09-26 NOTE — Telephone Encounter (Signed)
Meds ordered this encounter  Medications   lisinopril-hydrochlorothiazide (ZESTORETIC) 20-12.5 MG tablet    Sig: Take 1 tablet by mouth daily.    Dispense:  90 tablet    Refill:  0   atorvastatin (LIPITOR) 40 MG tablet    Sig: Take 1 tablet (40 mg total) by mouth daily.    Dispense:  90 tablet    Refill:  0   tamsulosin (FLOMAX) 0.4 MG CAPS capsule    Sig: Take 1 capsule (0.4 mg total) by mouth daily.    Dispense:  90 capsule    Refill:  0

## 2021-10-05 ENCOUNTER — Other Ambulatory Visit: Payer: Self-pay | Admitting: Family Medicine

## 2021-11-19 DIAGNOSIS — M545 Low back pain, unspecified: Secondary | ICD-10-CM | POA: Diagnosis not present

## 2021-11-19 DIAGNOSIS — M170 Bilateral primary osteoarthritis of knee: Secondary | ICD-10-CM | POA: Diagnosis not present

## 2021-11-22 ENCOUNTER — Ambulatory Visit (INDEPENDENT_AMBULATORY_CARE_PROVIDER_SITE_OTHER): Payer: Medicare Other | Admitting: Family Medicine

## 2021-11-22 ENCOUNTER — Encounter: Payer: Self-pay | Admitting: Family Medicine

## 2021-11-22 VITALS — BP 125/79 | HR 90 | Ht 74.0 in | Wt 164.0 lb

## 2021-11-22 DIAGNOSIS — N401 Enlarged prostate with lower urinary tract symptoms: Secondary | ICD-10-CM

## 2021-11-22 DIAGNOSIS — I10 Essential (primary) hypertension: Secondary | ICD-10-CM | POA: Diagnosis not present

## 2021-11-22 DIAGNOSIS — E782 Mixed hyperlipidemia: Secondary | ICD-10-CM

## 2021-11-22 DIAGNOSIS — R350 Frequency of micturition: Secondary | ICD-10-CM

## 2021-11-22 DIAGNOSIS — Z1211 Encounter for screening for malignant neoplasm of colon: Secondary | ICD-10-CM

## 2021-11-22 DIAGNOSIS — K4090 Unilateral inguinal hernia, without obstruction or gangrene, not specified as recurrent: Secondary | ICD-10-CM

## 2021-11-22 MED ORDER — ATORVASTATIN CALCIUM 40 MG PO TABS: 40.0000 mg | ORAL_TABLET | Freq: Every day | ORAL | 1 refills | Status: DC

## 2021-11-22 MED ORDER — TAMSULOSIN HCL 0.4 MG PO CAPS: 0.4000 mg | ORAL_CAPSULE | Freq: Every day | ORAL | 1 refills | Status: DC

## 2021-11-22 MED ORDER — LISINOPRIL-HYDROCHLOROTHIAZIDE 20-12.5 MG PO TABS: 1.0000 | ORAL_TABLET | Freq: Every day | ORAL | 1 refills | Status: DC

## 2021-11-22 NOTE — Patient Instructions (Signed)
Great to meet you! ?We'll be in touch with labs. ?

## 2021-11-23 LAB — CBC WITH DIFFERENTIAL/PLATELET
Absolute Monocytes: 639 cells/uL (ref 200–950)
Basophils Absolute: 14 cells/uL (ref 0–200)
Basophils Relative: 0.1 %
Eosinophils Absolute: 54 cells/uL (ref 15–500)
Eosinophils Relative: 0.4 %
HCT: 38.2 % — ABNORMAL LOW (ref 38.5–50.0)
Hemoglobin: 12.8 g/dL — ABNORMAL LOW (ref 13.2–17.1)
Lymphs Abs: 2326 cells/uL (ref 850–3900)
MCH: 31.1 pg (ref 27.0–33.0)
MCHC: 33.5 g/dL (ref 32.0–36.0)
MCV: 92.7 fL (ref 80.0–100.0)
MPV: 9.8 fL (ref 7.5–12.5)
Monocytes Relative: 4.7 %
Neutro Abs: 10567 cells/uL — ABNORMAL HIGH (ref 1500–7800)
Neutrophils Relative %: 77.7 %
Platelets: 311 10*3/uL (ref 140–400)
RBC: 4.12 10*6/uL — ABNORMAL LOW (ref 4.20–5.80)
RDW: 13.5 % (ref 11.0–15.0)
Total Lymphocyte: 17.1 %
WBC: 13.6 10*3/uL — ABNORMAL HIGH (ref 3.8–10.8)

## 2021-11-23 LAB — COMPLETE METABOLIC PANEL WITH GFR
AG Ratio: 1.1 (calc) (ref 1.0–2.5)
ALT: 17 U/L (ref 9–46)
AST: 19 U/L (ref 10–35)
Albumin: 3.7 g/dL (ref 3.6–5.1)
Alkaline phosphatase (APISO): 66 U/L (ref 35–144)
BUN: 15 mg/dL (ref 7–25)
CO2: 29 mmol/L (ref 20–32)
Calcium: 9.7 mg/dL (ref 8.6–10.3)
Chloride: 104 mmol/L (ref 98–110)
Creat: 1.03 mg/dL (ref 0.70–1.35)
Globulin: 3.4 g/dL (calc) (ref 1.9–3.7)
Glucose, Bld: 87 mg/dL (ref 65–139)
Potassium: 4.1 mmol/L (ref 3.5–5.3)
Sodium: 140 mmol/L (ref 135–146)
Total Bilirubin: 0.2 mg/dL (ref 0.2–1.2)
Total Protein: 7.1 g/dL (ref 6.1–8.1)
eGFR: 82 mL/min/{1.73_m2} (ref >=60)

## 2021-11-23 LAB — URINALYSIS, ROUTINE W REFLEX MICROSCOPIC
Bilirubin Urine: NEGATIVE
Glucose, UA: NEGATIVE
Hgb urine dipstick: NEGATIVE
Hyaline Cast: NONE SEEN /LPF
Ketones, ur: NEGATIVE
Nitrite: NEGATIVE
Protein, ur: NEGATIVE
RBC / HPF: NONE SEEN /HPF (ref 0–2)
Specific Gravity, Urine: 1.024 (ref 1.001–1.035)
Squamous Epithelial / HPF: NONE SEEN /HPF (ref <=5)
pH: 7 (ref 5.0–8.0)

## 2021-11-23 LAB — LIPID PANEL W/REFLEX DIRECT LDL
Cholesterol: 112 mg/dL (ref <200)
HDL: 72 mg/dL (ref >=40)
LDL Cholesterol (Calc): 27 mg/dL (calc)
Non-HDL Cholesterol (Calc): 40 mg/dL (calc) (ref <130)
Total CHOL/HDL Ratio: 1.6 (calc) (ref <5.0)
Triglycerides: 53 mg/dL (ref <150)

## 2021-11-23 LAB — MICROSCOPIC MESSAGE

## 2021-11-23 LAB — PSA: PSA: 2.64 ng/mL (ref <=4.00)

## 2021-11-23 LAB — TSH: TSH: 1.05 mIU/L (ref 0.40–4.50)

## 2021-11-24 DIAGNOSIS — K4090 Unilateral inguinal hernia, without obstruction or gangrene, not specified as recurrent: Secondary | ICD-10-CM | POA: Insufficient documentation

## 2021-11-24 DIAGNOSIS — Z1211 Encounter for screening for malignant neoplasm of colon: Secondary | ICD-10-CM | POA: Insufficient documentation

## 2021-11-24 NOTE — Assessment & Plan Note (Signed)
Updated Cologuard ordered. ?

## 2021-11-24 NOTE — Assessment & Plan Note (Signed)
Blood pressure well controlled at this time.  Recommend continuation of lisinopril with hydrochlorothiazide. ?

## 2021-11-24 NOTE — Progress Notes (Signed)
?Alpha Mysliwiec Waymire Sr. - 66 y.o. male MRN [id number]  Date of birth: Oct 24, 1955 ? ?Subjective ?Chief Complaint  ?Patient presents with  ? Transitions Of Care  ? ? ?HPI ?Castulo is a 66 year old male here today for a follow-up visit.  He is a former patient of Dr. Lyn Hollingshead.  Reports overall he is doing well at this time.  He has history of hypertension, BPH with recurrent UTI and hyperlipidemia.  Blood pressures remain well controlled with lisinopril/hydrochlorothiazide.  He has not had chest pain, shortness of breath, palpitations, headaches or vision changes.  He continues to tolerate atorvastatin well for management of hyperlipidemia.  He is due for updated labs. ? ?Lower urinary tract symptoms remain well controlled with Flomax.  He has seen urology in the past for recurrent UTIs.  Denies symptoms at this time. ? ?Additionally he is due for updated Cologuard. ? ?He does report an inguinal hernia as well that he has had for several months.  This does bother him at times but he does not want a surgical referral at this time. ? ?ROS:  A comprehensive ROS was completed and negative except as noted per HPI ? ? ?No Known Allergies ? ?Past Medical History:  ?Diagnosis Date  ? HLD (hyperlipidemia) 06/15/2018  ? Hypertension   ? ? ?Past Surgical History:  ?Procedure Laterality Date  ? Arm surgery    ? arthroscopic knee surgery    ? ? ?Social History  ? ?Socioeconomic History  ? Marital status: Married  ?  Spouse name: Not on file  ? Number of children: 3  ? Years of education: Not on file  ? Highest education level: Not on file  ?Occupational History  ?  Comment: Retired  ?Tobacco Use  ? Smoking status: Some Days  ?  Types: Cigars  ? Smokeless tobacco: Current  ? Tobacco comments:  ?  Occasional use of cigars  ?Substance and Sexual Activity  ? Alcohol use: Yes  ?  Alcohol/week: 1.0 standard drink  ?  Types: 1 Cans of beer per week  ?  Comment: Occasional  ? Drug use: No  ? Sexual activity: Not Currently  ?Other Topics Concern   ? Not on file  ?Social History Narrative  ? Not on file  ? ?Social Determinants of Health  ? ?Financial Resource Strain: Not on file  ?Food Insecurity: Not on file  ?Transportation Needs: Not on file  ?Physical Activity: Not on file  ?Stress: Not on file  ?Social Connections: Not on file  ? ? ?Family History  ?Problem Relation Age of Onset  ? Diabetes Mother   ? Hypertension Mother   ? Hypertension Father   ? ? ?Health Maintenance  ?Topic Date Due  ? COVID-19 Vaccine (4 - Booster for Pfizer series) 12/08/2021 (Originally 07/06/2020)  ? Zoster Vaccines- Shingrix (2 of 2) 02/21/2022 (Originally 10/24/2019)  ? Fecal DNA (Cologuard)  04/11/2022 (Originally 08/16/2021)  ? INFLUENZA VACCINE  03/11/2022  ? TETANUS/TDAP  04/21/2026  ? Hepatitis C Screening  Completed  ? HIV Screening  Completed  ? HPV VACCINES  Aged Out  ? ? ? ?----------------------------------------------------------------------------------------------------------------------------------------------------------------------------------------------------------------- ?Physical Exam ?BP 125/79 (BP Location: Right Arm, Patient Position: Sitting, Cuff Size: Small)   Pulse 90   Ht 6\' 2"  (1.88 m)   Wt 164 lb (74.4 kg)   SpO2 98%   BMI 21.06 kg/m?  ? ?Physical Exam ?Constitutional:   ?   Appearance: Normal appearance.  ?Eyes:  ?   General: No scleral icterus. ?Cardiovascular:  ?  Rate and Rhythm: Normal rate and regular rhythm.  ?Pulmonary:  ?   Effort: Pulmonary effort is normal.  ?   Breath sounds: Normal breath sounds.  ?Musculoskeletal:  ?   Cervical back: Neck supple.  ?Neurological:  ?   Mental Status: He is alert.  ?Psychiatric:     ?   Mood and Affect: Mood normal.     ?   Behavior: Behavior normal.  ? ? ?------------------------------------------------------------------------------------------------------------------------------------------------------------------------------------------------------------------- ?Assessment and Plan ? ?HTN  (hypertension) ?Blood pressure well controlled at this time.  Recommend continuation of lisinopril with hydrochlorothiazide. ? ?BPH (benign prostatic hyperplasia) ?Updated PSA and urinalysis today.  Continue Flomax at current strength. ? ?HLD (hyperlipidemia) ?Continues to do well with atorvastatin.  Continue at current strength. ? ?Colon cancer screening ?Updated Cologuard ordered. ? ?Left inguinal hernia ?Red flags discussed.  He declined surgical referral at this time. ? ? ?Meds ordered this encounter  ?Medications  ? atorvastatin (LIPITOR) 40 MG tablet  ?  Sig: Take 1 tablet (40 mg total) by mouth daily.  ?  Dispense:  90 tablet  ?  Refill:  1  ? lisinopril-hydrochlorothiazide (ZESTORETIC) 20-12.5 MG tablet  ?  Sig: Take 1 tablet by mouth daily.  ?  Dispense:  90 tablet  ?  Refill:  1  ? tamsulosin (FLOMAX) 0.4 MG CAPS capsule  ?  Sig: Take 1 capsule (0.4 mg total) by mouth daily.  ?  Dispense:  90 capsule  ?  Refill:  1  ? ? ?Return in about 6 months (around 05/24/2022) for HTN. ? ? ? ?This visit occurred during the SARS-CoV-2 public health emergency.  Safety protocols were in place, including screening questions prior to the visit, additional usage of staff PPE, and extensive cleaning of exam room while observing appropriate contact time as indicated for disinfecting solutions.  ? ?

## 2021-11-24 NOTE — Assessment & Plan Note (Signed)
Continues to do well with atorvastatin.  Continue at current strength. 

## 2021-11-24 NOTE — Assessment & Plan Note (Signed)
Updated PSA and urinalysis today.  Continue Flomax at current strength. ?

## 2021-11-24 NOTE — Assessment & Plan Note (Signed)
Red flags discussed.  He declined surgical referral at this time. ?

## 2021-11-29 ENCOUNTER — Other Ambulatory Visit: Payer: Self-pay | Admitting: Family Medicine

## 2021-11-29 DIAGNOSIS — D72829 Elevated white blood cell count, unspecified: Secondary | ICD-10-CM

## 2021-12-11 DIAGNOSIS — Z1211 Encounter for screening for malignant neoplasm of colon: Secondary | ICD-10-CM | POA: Diagnosis not present

## 2021-12-20 LAB — COLOGUARD: COLOGUARD: NEGATIVE

## 2021-12-26 NOTE — Progress Notes (Signed)
Result note sent via MyChart:   Your cologuard is negative.  Recommend repeating in 3 years.

## 2021-12-26 NOTE — Progress Notes (Unsigned)
Received Epic notification that pt has not read MyChart message regarding results.   LVM for pt to return call.  Tiajuana Amass, CMA

## 2021-12-26 NOTE — Progress Notes (Signed)
Pt informed of results.  Pt expressed understanding and is agreeable.  T. Arianna Haydon, CMA 

## 2021-12-31 DIAGNOSIS — D72829 Elevated white blood cell count, unspecified: Secondary | ICD-10-CM | POA: Diagnosis not present

## 2021-12-31 LAB — CBC WITH DIFFERENTIAL/PLATELET
Absolute Monocytes: 418 cells/uL (ref 200–950)
Basophils Absolute: 57 cells/uL (ref 0–200)
Basophils Relative: 0.7 %
Eosinophils Absolute: 467 cells/uL (ref 15–500)
Eosinophils Relative: 5.7 %
HCT: 36.8 % — ABNORMAL LOW (ref 38.5–50.0)
Hemoglobin: 12.4 g/dL — ABNORMAL LOW (ref 13.2–17.1)
Lymphs Abs: 2255 cells/uL (ref 850–3900)
MCH: 31.3 pg (ref 27.0–33.0)
MCHC: 33.7 g/dL (ref 32.0–36.0)
MCV: 92.9 fL (ref 80.0–100.0)
MPV: 10.5 fL (ref 7.5–12.5)
Monocytes Relative: 5.1 %
Neutro Abs: 5002 cells/uL (ref 1500–7800)
Neutrophils Relative %: 61 %
Platelets: 269 10*3/uL (ref 140–400)
RBC: 3.96 10*6/uL — ABNORMAL LOW (ref 4.20–5.80)
RDW: 14.5 % (ref 11.0–15.0)
Total Lymphocyte: 27.5 %
WBC: 8.2 10*3/uL (ref 3.8–10.8)

## 2022-04-02 ENCOUNTER — Encounter: Payer: Self-pay | Admitting: General Practice

## 2022-04-21 ENCOUNTER — Ambulatory Visit (INDEPENDENT_AMBULATORY_CARE_PROVIDER_SITE_OTHER): Payer: Medicare Other | Admitting: Family Medicine

## 2022-04-21 DIAGNOSIS — Z0000 Encounter for general adult medical examination without abnormal findings: Secondary | ICD-10-CM | POA: Diagnosis not present

## 2022-04-21 NOTE — Patient Instructions (Addendum)
MEDICARE ANNUAL WELLNESS VISIT Health Maintenance Summary and Written Plan of Care  [name] ,  Thank you for allowing me to perform your Medicare Annual Wellness Visit and for your ongoing commitment to your health.   Health Maintenance & Immunization History Health Maintenance  Topic Date Due   COVID-19 Vaccine (4 - Pfizer series) 05/07/2022 (Originally 07/06/2020)   Zoster Vaccines- Shingrix (2 of 2) 07/21/2022 (Originally 10/24/2019)   INFLUENZA VACCINE  11/09/2022 (Originally 03/11/2022)   Fecal DNA (Cologuard)  12/11/2024   TETANUS/TDAP  04/21/2026   Hepatitis C Screening  Completed   HIV Screening  Completed   HPV VACCINES  Aged Out   Immunization History  Administered Date(s) Administered   Influenza,inj,Quad PF,6+ Mos 04/21/2016, 06/24/2017, 06/15/2018, 04/19/2019, 07/02/2020   PFIZER(Purple Top)SARS-COV-2 Vaccination 10/25/2019, 11/15/2019, 05/11/2020   Tdap 04/21/2016   Zoster Recombinat (Shingrix) 08/29/2019    These are the patient goals that we discussed:  Goals Addressed               This Visit's Progress     Patient Stated (pt-stated)        Continue maintaining a healthy lifestyle.         This is a list of Health Maintenance Items that are overdue or due now: Influenza vaccine Shingrix vaccine - 2nd dose.  Orders/Referrals Placed Today: No orders of the defined types were placed in this encounter.  (Contact our referral department at [phone] if you have not spoken with someone about your referral appointment within the next 5 days)    Follow-up Plan Follow-up with Everrett Coombe, DO as planned Please bring the records of your shingles vaccine to the office. Medicare wellness visit in one year. Patient will access AVS on my chart.      Health Maintenance, Male Adopting a healthy lifestyle and getting preventive care are important in promoting health and wellness. Ask your health care provider about: The right schedule for you to  have regular tests and exams. Things you can do on your own to prevent diseases and keep yourself healthy. What should I know about diet, weight, and exercise? Eat a healthy diet  Eat a diet that includes plenty of vegetables, fruits, low-fat dairy products, and lean protein. Do not eat a lot of foods that are high in solid fats, added sugars, or sodium. Maintain a healthy weight Body mass index (BMI) is a measurement that can be used to identify possible weight problems. It estimates body fat based on height and weight. Your health care provider can help determine your BMI and help you achieve or maintain a healthy weight. Get regular exercise Get regular exercise. This is one of the most important things you can do for your health. Most adults should: Exercise for at least 150 minutes each week. The exercise should increase your heart rate and make you sweat (moderate-intensity exercise). Do strengthening exercises at least twice a week. This is in addition to the moderate-intensity exercise. Spend less time sitting. Even light physical activity can be beneficial. Watch cholesterol and blood lipids Have your blood tested for lipids and cholesterol at 64 years of age, then have this test every 5 years. You may need to have your cholesterol levels checked more often if: Your lipid or cholesterol levels are high. You are older than 64 years of age. You are at high risk for heart disease. What should I know about cancer screening? Many types of cancers can be detected early and may often be prevented.  Depending on your health history and family history, you may need to have cancer screening at various ages. This may include screening for: Colorectal cancer. Prostate cancer. Skin cancer. Lung cancer. What should I know about heart disease, diabetes, and high blood pressure? Blood pressure and heart disease High blood pressure causes heart disease and increases the risk of stroke. This is  more likely to develop in people who have high blood pressure readings or are overweight. Talk with your health care provider about your target blood pressure readings. Have your blood pressure checked: Every 3-5 years if you are 7-86 years of age. Every year if you are 24 years old or older. If you are between the ages of 10 and 66 and are a current or former smoker, ask your health care provider if you should have a one-time screening for abdominal aortic aneurysm (AAA). Diabetes Have regular diabetes screenings. This checks your fasting blood sugar level. Have the screening done: Once every three years after age 7 if you are at a normal weight and have a low risk for diabetes. More often and at a younger age if you are overweight or have a high risk for diabetes. What should I know about preventing infection? Hepatitis B If you have a higher risk for hepatitis B, you should be screened for this virus. Talk with your health care provider to find out if you are at risk for hepatitis B infection. Hepatitis C Blood testing is recommended for: Everyone born from [birth year] through 1963. Anyone with known risk factors for hepatitis C. Sexually transmitted infections (STIs) You should be screened each year for STIs, including gonorrhea and chlamydia, if: You are sexually active and are younger than 64 years of age. You are older than 64 years of age and your health care provider tells you that you are at risk for this type of infection. Your sexual activity has changed since you were last screened, and you are at increased risk for chlamydia or gonorrhea. Ask your health care provider if you are at risk. Ask your health care provider about whether you are at high risk for HIV. Your health care provider may recommend a prescription medicine to help prevent HIV infection. If you choose to take medicine to prevent HIV, you should first get tested for HIV. You should then be tested every 3 months for as  long as you are taking the medicine. Follow these instructions at home: Alcohol use Do not drink alcohol if your health care provider tells you not to drink. If you drink alcohol: Limit how much you have to 0-2 drinks a day. Know how much alcohol is in your drink. In the [place] one drink equals one 12 oz bottle of beer (355 mL), one 5 oz glass of wine (148 mL), or one 1 oz glass of hard liquor (44 mL). Lifestyle Do not use any products that contain nicotine or tobacco. These products include cigarettes, chewing tobacco, and vaping devices, such as e-cigarettes. If you need help quitting, ask your health care provider. Do not use street drugs. Do not share needles. Ask your health care provider for help if you need support or information about quitting drugs. General instructions Schedule regular health, dental, and eye exams. Stay current with your vaccines. Tell your health care provider if: You often feel depressed. You have ever been abused or do not feel safe at home. Summary Adopting a healthy lifestyle and getting preventive care are important in promoting health and wellness. Follow  your health care provider's instructions about healthy diet, exercising, and getting tested or screened for diseases. Follow your health care provider's instructions on monitoring your cholesterol and blood pressure. This information is not intended to replace advice given to you by your health care provider. Make sure you discuss any questions you have with your health care provider. Document Revised: 12/17/2020 Document Reviewed: 12/17/2020 Elsevier Patient Education  [place].

## 2022-04-21 NOTE — Progress Notes (Signed)
MEDICARE ANNUAL WELLNESS VISIT  04/21/2022  Telephone Visit Disclaimer This Medicare AWV was conducted by telephone due to national recommendations for restrictions regarding the COVID-19 Pandemic (e.g. social distancing).  I verified, using two identifiers, that I am speaking with Jordan Harbor B Buelna Sr. or their authorized healthcare agent. I discussed the limitations, risks, security, and privacy concerns of performing an evaluation and management service by telephone and the potential availability of an in-person appointment in the future. The patient expressed understanding and agreed to proceed.  Location of Patient: Home Location of Provider (nurse):  In the office.  Subjective:    Jordan B Mcgillis Sr. is a 67 y.o. male patient of Everrett Coombe, DO who had a Medicare Annual Wellness Visit today via telephone. Jordan Coffey is Retired and lives with their spouse. he has 3 children. he reports that he is socially active and does interact with friends/family regularly. he is moderately physically active and enjoys tracking and playing golf.  Patient Care Team: Everrett Coombe, DO as PCP - General (Family Medicine)     04/21/2022   10:11 AM 04/21/2016    3:00 PM 04/16/2016    7:17 PM  Advanced Directives  Does Patient Have a Medical Advance Directive? No No No  Would patient like information on creating a medical advance directive? No - Patient declined No - patient declined information Yes - Educational materials given    Hospital Utilization Over the Past 12 Months: # of hospitalizations or ER visits: 0 # of surgeries: 0  Review of Systems    Patient reports that his overall health is better compared to last year.  History obtained from chart review and the patient  Patient Reported Readings (BP, Pulse, CBG, Weight, etc) none  Pain Assessment Pain : No/denies pain     Current Medications & Allergies (verified) Allergies as of 04/21/2022   No Known Allergies      Medication  List        Accurate as of April 21, 2022 10:26 AM. If you have any questions, ask your nurse or doctor.          atorvastatin 40 MG tablet Commonly known as: LIPITOR Take 1 tablet (40 mg total) by mouth daily.   ibuprofen 200 MG tablet Commonly known as: ADVIL Take 200 mg by mouth every 6 (six) hours as needed for fever, headache or mild pain.   lisinopril-hydrochlorothiazide 20-12.5 MG tablet Commonly known as: ZESTORETIC Take 1 tablet by mouth daily.   multivitamin with minerals Tabs tablet Take 1 tablet by mouth daily.   tamsulosin 0.4 MG Caps capsule Commonly known as: FLOMAX Take 1 capsule (0.4 mg total) by mouth daily.        History (reviewed): Past Medical History:  Diagnosis Date   HLD (hyperlipidemia) 06/15/2018   Hypertension    Past Surgical History:  Procedure Laterality Date   Arm surgery     arthroscopic knee surgery     Family History  Problem Relation Age of Onset   Diabetes Mother    Hypertension Mother    Hypertension Father    Social History   Socioeconomic History   Marital status: Married    Spouse name: Tonia Ghent Notarianni   Number of children: 3   Years of education: 16   Highest education level: Bachelor's degree (e.g., BA, AB, BS)  Occupational History    Comment: Retired  Tobacco Use   Smoking status: Some Days    Types: Cigars   Smokeless tobacco:  Current   Tobacco comments:    Occasional use of cigars  Substance and Sexual Activity   Alcohol use: Yes    Alcohol/week: 1.0 standard drink of alcohol    Types: 1 Cans of beer per week    Comment: Occasional   Drug use: No   Sexual activity: Not Currently  Other Topics Concern   Not on file  Social History Narrative   Lives with his wife. Enjoys tracking and playing golf.   Social Determinants of Health   Financial Resource Strain: Low Risk  (04/21/2022)   Overall Financial Resource Strain (CARDIA)    Difficulty of Paying Living Expenses: Not hard at all   Food Insecurity: No Food Insecurity (04/21/2022)   Hunger Vital Sign    Worried About Running Out of Food in the Last Year: Never true    Ran Out of Food in the Last Year: Never true  Transportation Needs: No Transportation Needs (04/21/2022)   PRAPARE - Administrator, Civil Service (Medical): No    Lack of Transportation (Non-Medical): No  Physical Activity: Sufficiently Active (04/21/2022)   Exercise Vital Sign    Days of Exercise per Week: 7 days    Minutes of Exercise per Session: 40 min  Stress: No Stress Concern Present (04/21/2022)   Jordan Coffey    Feeling of Stress : Not at all  Social Connections: Moderately Integrated (04/21/2022)   Social Connection and Isolation Panel [NHANES]    Frequency of Communication with Friends and Family: More than three times a week    Frequency of Social Gatherings with Friends and Family: Twice a week    Attends Religious Services: More than 4 times per year    Active Member of [employer] or Organizations: No    Attends Banker Meetings: Never    Marital Status: Married    Activities of Daily Living    04/21/2022   10:15 AM  In your present state of health, do you have any difficulty performing the following activities:  Hearing? 0  Vision? 0  Difficulty concentrating or making decisions? 0  Walking or climbing stairs? 0  Dressing or bathing? 0  Doing errands, shopping? 0  Preparing Food and eating ? N  Using the Toilet? N  In the past six months, have you accidently leaked urine? N  Do you have problems with loss of bowel control? N  Managing your Medications? N  Managing your Finances? N  Housekeeping or managing your Housekeeping? N    Patient Education/ Literacy How often do you need to have someone help you when you read instructions, pamphlets, or other written materials from your doctor or pharmacy?: 1 - Never What is the last grade level you  completed in school?: Bachelor's degree  Exercise Current Exercise Habits: Home exercise routine, Type of exercise: walking, Time (Minutes): 40, Frequency (Times/Week): 7, Weekly Exercise (Minutes/Week): 280, Intensity: Moderate, Exercise limited by: None identified  Diet Patient reports consuming 4 meals a day and 0 snack(s) a day Patient reports that his primary diet is: Regular Patient reports that she does have regular access to food.   Depression Screen    04/21/2022   10:15 AM 11/22/2021   10:46 AM 02/13/2021   10:18 AM 02/12/2021   11:09 AM 09/28/2020   10:09 AM 08/29/2019    9:28 AM 07/15/2018   11:07 AM  PHQ 2/9 Scores  PHQ - 2 Score 0 0 0  0  3 0  PHQ- 9 Score      7      Information is confidential and restricted. Go to Review Flowsheets to unlock data.     Fall Risk    04/21/2022   10:14 AM 11/22/2021   10:46 AM 09/28/2020   10:09 AM 08/29/2019    9:28 AM 06/24/2017    8:58 AM  Fall Risk   Falls in the past year? 0 0 0 0 No  Number falls in past yr: 0 0 0    Injury with Fall? 0 0 0    Risk for fall due to : No Fall Risks No Fall Risks No Fall Risks    Follow up Falls evaluation completed Falls evaluation completed Falls evaluation completed       Objective:  Jordan B Raimer Sr. seemed alert and oriented and he participated appropriately during our telephone visit.  Blood Pressure Weight BMI  BP Readings from Last 3 Encounters:  11/22/21 125/79  02/13/21 118/81  01/08/21 123/71   Wt Readings from Last 3 Encounters:  11/22/21 164 lb (74.4 kg)  02/13/21 164 lb 9.6 oz (74.7 kg)  01/08/21 172 lb 0.6 oz (78 kg)   BMI Readings from Last 1 Encounters:  11/22/21 21.06 kg/m    *Unable to obtain current vital signs, weight, and BMI due to telephone visit type  Hearing/Vision  Jordan Coffey did not seem to have difficulty with hearing/understanding during the telephone conversation Reports that he has not had a formal eye exam by an eye care professional within the past  year Reports that he has not had a formal hearing evaluation within the past year *Unable to fully assess hearing and vision during telephone visit type  Cognitive Function:    04/21/2022   10:19 AM  6CIT Screen  What Year? 0 points  What month? 0 points  What time? 0 points  Count back from 20 0 points  Months in reverse 0 points  Repeat phrase 2 points  Total Score 2 points   (Normal:0-7, Significant for Dysfunction: >8)  Normal Cognitive Function Screening: Yes   Immunization & Health Maintenance Record Immunization History  Administered Date(s) Administered   Influenza,inj,Quad PF,6+ Mos 04/21/2016, 06/24/2017, 06/15/2018, 04/19/2019, 07/02/2020   PFIZER(Purple Top)SARS-COV-2 Vaccination 10/25/2019, 11/15/2019, 05/11/2020   Tdap 04/21/2016   Zoster Recombinat (Shingrix) 08/29/2019    Health Maintenance  Topic Date Due   COVID-19 Vaccine (4 - Pfizer series) 05/07/2022 (Originally 07/06/2020)   Zoster Vaccines- Shingrix (2 of 2) 07/21/2022 (Originally 10/24/2019)   INFLUENZA VACCINE  11/09/2022 (Originally 03/11/2022)   Fecal DNA (Cologuard)  12/11/2024   TETANUS/TDAP  04/21/2026   Hepatitis C Screening  Completed   HIV Screening  Completed   HPV VACCINES  Aged Out       Assessment  This is a routine wellness examination for Jordan CoffeyMarland Kitchen  Health Maintenance: Due or Overdue There are no preventive care reminders to display for this patient.   Jordan B Agerton Sr. does not need a referral for Community Assistance: Care Management:   no Social Work:    no Prescription Assistance:  no Nutrition/Diabetes Education:  no   Plan:  Personalized Goals  Goals Addressed               This Visit's Progress     Patient Stated (pt-stated)        Continue maintaining a healthy lifestyle.       Personalized Health Maintenance & Screening Recommendations  Influenza  vaccine Shingrix vaccine - 2nd dose.  Lung Cancer Screening Recommended: yes; patient  declined at this time. (Low Dose CT Chest recommended if Age 40-80 years, 30 pack-year currently smoking OR have quit w/in past 15 years) Hepatitis C Screening recommended: no HIV Screening recommended: no  Advanced Directives: Written information was not prepared per patient's request.  Referrals & Orders No orders of the defined types were placed in this encounter.   Follow-up Plan Follow-up with Everrett Coombe, DO as planned Please bring the records of your shingles vaccine to the office. Medicare wellness visit in one year. Patient will access AVS on my chart.   I have personally reviewed and noted the following in the patient's chart:   Medical and social history Use of alcohol, tobacco or illicit drugs  Current medications and supplements Functional ability and status Nutritional status Physical activity Advanced directives List of other physicians Hospitalizations, surgeries, and ER visits in previous 12 months Vitals Screenings to include cognitive, depression, and falls Referrals and appointments  In addition, I have reviewed and discussed with Jordan B Tessier Sr. certain preventive protocols, quality metrics, and best practice recommendations. A written personalized care plan for preventive services as well as general preventive health recommendations is available and can be mailed to the patient at his request.      Modesto Charon, RN BSN  04/21/2022

## 2022-05-26 ENCOUNTER — Ambulatory Visit (INDEPENDENT_AMBULATORY_CARE_PROVIDER_SITE_OTHER): Payer: Medicare Other | Admitting: Family Medicine

## 2022-05-26 ENCOUNTER — Encounter: Payer: Self-pay | Admitting: Family Medicine

## 2022-05-26 VITALS — BP 125/73 | HR 89 | Ht 74.0 in | Wt 160.2 lb

## 2022-05-26 DIAGNOSIS — R350 Frequency of micturition: Secondary | ICD-10-CM

## 2022-05-26 DIAGNOSIS — R3915 Urgency of urination: Secondary | ICD-10-CM

## 2022-05-26 DIAGNOSIS — N401 Enlarged prostate with lower urinary tract symptoms: Secondary | ICD-10-CM | POA: Diagnosis not present

## 2022-05-26 DIAGNOSIS — N390 Urinary tract infection, site not specified: Secondary | ICD-10-CM

## 2022-05-26 DIAGNOSIS — Z23 Encounter for immunization: Secondary | ICD-10-CM | POA: Diagnosis not present

## 2022-05-26 DIAGNOSIS — I10 Essential (primary) hypertension: Secondary | ICD-10-CM

## 2022-05-26 LAB — POCT URINALYSIS DIP (CLINITEK)
Glucose, UA: NEGATIVE mg/dL
Ketones, POC UA: NEGATIVE mg/dL
Nitrite, UA: POSITIVE — AB
POC PROTEIN,UA: 30 — AB
Spec Grav, UA: 1.02 (ref 1.010–1.025)
Urobilinogen, UA: 1 E.U./dL
pH, UA: 6.5 (ref 5.0–8.0)

## 2022-05-26 MED ORDER — SULFAMETHOXAZOLE-TRIMETHOPRIM 800-160 MG PO TABS: 1.0000 | ORAL_TABLET | Freq: Two times a day (BID) | ORAL | 0 refills | Status: AC

## 2022-05-26 NOTE — Progress Notes (Signed)
Jordan Lang Biebel Sr. - 66 y.o. male MRN [id number]  Date of birth: [birth year]  Subjective Chief Complaint  Patient presents with   Hypertension   Urinary Frequency    HPI Jordan B Commisso Sr. Is a 66 y.o. male here today for follow up visit.   He has complaint of urinary urgency and frequency.  Feels like he needs to urinate but only a little comes out.  Denies pain or burning.  He does have pressure.  He has not had blood in his urine.  Denies fever, chills or flank pain.  Does have history of BPH and is taking flomax  BP has remained well controlled with lisinopril/hctz.  Denies side effects from medication.  He has not chest pain, shortness of breath, palpitations, headache or vision changes.    ROS:  A comprehensive ROS was completed and negative except as noted per HPI  No Known Allergies  Past Medical History:  Diagnosis Date   HLD (hyperlipidemia) 06/15/2018   Hypertension     Past Surgical History:  Procedure Laterality Date   Arm surgery     arthroscopic knee surgery      Social History   Socioeconomic History   Marital status: Married    Spouse name: [name]   Number of children: 3   Years of education: 16   Highest education level: Bachelor's degree (e.g., BA, AB, BS)  Occupational History    Comment: Retired  Tobacco Use   Smoking status: Some Days    Types: Cigars   Smokeless tobacco: Current   Tobacco comments:    Occasional use of cigars  Substance and Sexual Activity   Alcohol use: Yes    Alcohol/week: 1.0 standard drink of alcohol    Types: 1 Cans of beer per week    Comment: Occasional   Drug use: No   Sexual activity: Not Currently  Other Topics Concern   Not on file  Social History Narrative   Lives with his wife. Enjoys tracking and playing golf.   Social Determinants of Health   Financial Resource Strain: Low Risk  (04/21/2022)   Overall Financial Resource Strain (CARDIA)    Difficulty of Paying Living Expenses: Not hard at  all  Food Insecurity: No Food Insecurity (04/21/2022)   Hunger Vital Sign    Worried About Running Out of Food in the Last Year: Never true    Ran Out of Food in the Last Year: Never true  Transportation Needs: No Transportation Needs (04/21/2022)   PRAPARE - Hydrologist (Medical): No    Lack of Transportation (Non-Medical): No  Physical Activity: Sufficiently Active (04/21/2022)   Exercise Vital Sign    Days of Exercise per Week: 7 days    Minutes of Exercise per Session: 40 min  Stress: No Stress Concern Present (04/21/2022)   [place]    Feeling of Stress : Not at all  Social Connections: Moderately Integrated (04/21/2022)   Social Connection and Isolation Panel [NHANES]    Frequency of Communication with Friends and Family: More than three times a week    Frequency of Social Gatherings with Friends and Family: Twice a week    Attends Religious Services: More than 4 times per year    Active Member of [employer] or Organizations: No    Attends Archivist Meetings: Never    Marital Status: Married    Family History  Problem Relation Age  of Onset   Diabetes Mother    Hypertension Mother    Hypertension Father     Health Maintenance  Topic Date Due   INFLUENZA VACCINE  11/09/2022 (Originally 03/11/2022)   Fecal DNA (Cologuard)  12/11/2024   TETANUS/TDAP  04/21/2026   COVID-19 Vaccine  Completed   Hepatitis C Screening  Completed   HIV Screening  Completed   Zoster Vaccines- Shingrix  Completed   HPV VACCINES  Aged Out     ----------------------------------------------------------------------------------------------------------------------------------------------------------------------------------------------------------------- Physical Exam BP 125/73   Pulse 89   Ht 6\' 2"  (1.88 m)   Wt 160 lb 4 oz (72.7 kg)   SpO2 100%   BMI 20.57 kg/m   Physical Exam Constitutional:       Appearance: Normal appearance.  Eyes:     General: No scleral icterus. Cardiovascular:     Rate and Rhythm: Normal rate and regular rhythm.  Pulmonary:     Effort: Pulmonary effort is normal.     Breath sounds: Normal breath sounds.  Neurological:     Mental Status: He is alert.  Psychiatric:        Mood and Affect: Mood normal.        Behavior: Behavior normal.     ------------------------------------------------------------------------------------------------------------------------------------------------------------------------------------------------------------------- Assessment and Plan  HTN (hypertension) BP is well controlled at this time.  Continue lisinopril/hctz at current strength.    Recurrent UTI UA consistent with UTI.  Will start bactrim BID x14 days.  This will also cover for possible prostatitis as well.  Culture sent and will adjust if needed.    Meds ordered this encounter  Medications   sulfamethoxazole-trimethoprim (BACTRIM DS) 800-160 MG tablet    Sig: Take 1 tablet by mouth 2 (two) times daily for 14 days.    Dispense:  28 tablet    Refill:  0    No follow-ups on file.    This visit occurred during the SARS-CoV-2 public health emergency.  Safety protocols were in place, including screening questions prior to the visit, additional usage of staff PPE, and extensive cleaning of exam room while observing appropriate contact time as indicated for disinfecting solutions.

## 2022-05-26 NOTE — Assessment & Plan Note (Signed)
BP is well controlled at this time.  Continue lisinopril/hctz at current strength.

## 2022-05-26 NOTE — Patient Instructions (Signed)
Start bactrim for urinary infection  Let me know if worsening.

## 2022-05-26 NOTE — Assessment & Plan Note (Signed)
UA consistent with UTI.  Will start bactrim BID x14 days.  This will also cover for possible prostatitis as well.  Culture sent and will adjust if needed.

## 2022-05-27 LAB — PSA: PSA: 2.15 ng/mL (ref <=4.00)

## 2022-05-29 ENCOUNTER — Other Ambulatory Visit: Payer: Self-pay | Admitting: Family Medicine

## 2022-05-29 LAB — URINE CULTURE
MICRO NUMBER:: 14060902
SPECIMEN QUALITY:: ADEQUATE

## 2022-07-16 ENCOUNTER — Encounter: Payer: Self-pay | Admitting: Family Medicine

## 2022-07-17 MED ORDER — ATORVASTATIN CALCIUM 40 MG PO TABS: 40.0000 mg | ORAL_TABLET | Freq: Every day | ORAL | 1 refills | Status: DC

## 2022-08-20 ENCOUNTER — Other Ambulatory Visit: Payer: Self-pay | Admitting: Family Medicine

## 2022-09-16 ENCOUNTER — Ambulatory Visit (INDEPENDENT_AMBULATORY_CARE_PROVIDER_SITE_OTHER): Payer: Medicare Other | Admitting: Sports Medicine

## 2022-09-16 ENCOUNTER — Encounter: Payer: Self-pay | Admitting: Sports Medicine

## 2022-09-16 VITALS — BP 116/76 | HR 87 | Temp 97.9°F | Ht 74.0 in | Wt 161.0 lb

## 2022-09-16 DIAGNOSIS — N390 Urinary tract infection, site not specified: Secondary | ICD-10-CM

## 2022-09-16 LAB — POCT URINALYSIS DIP (CLINITEK)
Glucose, UA: NEGATIVE mg/dL
Ketones, POC UA: NEGATIVE mg/dL
Nitrite, UA: POSITIVE — AB
POC PROTEIN,UA: 30 — AB
Spec Grav, UA: 1.015 (ref 1.010–1.025)
Urobilinogen, UA: 1 E.U./dL — AB
pH, UA: 8 (ref 5.0–8.0)

## 2022-09-16 LAB — UNLABELED: Test Ordered On Req: 395

## 2022-09-16 MED ORDER — CEFDINIR 300 MG PO CAPS: 300.0000 mg | ORAL_CAPSULE | Freq: Two times a day (BID) | ORAL | 0 refills | Status: DC

## 2022-09-16 NOTE — Assessment & Plan Note (Signed)
Pleasant 67 year old male, he has had recurrent episodes of prostatitis, a recent urine culture did grow out E. coli, the best sensitivities/mixed were with cephalosporins. He did get Bactrim for 14 days which helped to some degree but he is having recurrence of symptoms. Urgency, burning, frequency, no penile discharge. We will start with oral third-generation cephalosporin, Omnicef, can try Suprax if not covered, will do it for 28 days this time, I would like to culture the urine again today. Return to see me in 4 weeks, continue Flomax as this helps his symptoms.

## 2022-09-16 NOTE — Progress Notes (Signed)
    Procedures performed today:    None.  Independent interpretation of notes and tests performed by another provider:   None.  Brief History, Exam, Impression, and Recommendations:    Recurrent UTI Pleasant 67 year old male, he has had recurrent episodes of prostatitis, a recent urine culture did grow out E. coli, the best sensitivities/mixed were with cephalosporins. He did get Bactrim for 14 days which helped to some degree but he is having recurrence of symptoms. Urgency, burning, frequency, no penile discharge. We will start with oral third-generation cephalosporin, Omnicef, can try Suprax if not covered, will do it for 28 days this time, I would like to culture the urine again today. Return to see me in 4 weeks, continue Flomax as this helps his symptoms.  Chronic process with exacerbation and pharmacologic intervention  ____________________________________________ Gwen Her. Dianah Field, M.D., ABFM., CAQSM., AME. Primary Care and Sports Medicine [facility]  Adjunct Professor of [employer] [facility] of Medicine  Risk manager

## 2022-10-14 ENCOUNTER — Ambulatory Visit (INDEPENDENT_AMBULATORY_CARE_PROVIDER_SITE_OTHER): Payer: Medicare Other

## 2022-10-14 ENCOUNTER — Encounter: Payer: Self-pay | Admitting: Sports Medicine

## 2022-10-14 ENCOUNTER — Ambulatory Visit (INDEPENDENT_AMBULATORY_CARE_PROVIDER_SITE_OTHER): Payer: Medicare Other | Admitting: Sports Medicine

## 2022-10-14 VITALS — BP 125/78 | HR 98

## 2022-10-14 DIAGNOSIS — M47812 Spondylosis without myelopathy or radiculopathy, cervical region: Secondary | ICD-10-CM | POA: Diagnosis not present

## 2022-10-14 DIAGNOSIS — N390 Urinary tract infection, site not specified: Secondary | ICD-10-CM

## 2022-10-14 DIAGNOSIS — R221 Localized swelling, mass and lump, neck: Secondary | ICD-10-CM

## 2022-10-14 DIAGNOSIS — M4312 Spondylolisthesis, cervical region: Secondary | ICD-10-CM | POA: Diagnosis not present

## 2022-10-14 DIAGNOSIS — M542 Cervicalgia: Secondary | ICD-10-CM | POA: Diagnosis not present

## 2022-10-14 DIAGNOSIS — M4802 Spinal stenosis, cervical region: Secondary | ICD-10-CM | POA: Diagnosis not present

## 2022-10-14 MED ORDER — MELOXICAM 15 MG PO TABS: ORAL_TABLET | ORAL | 3 refills | Status: DC

## 2022-10-14 MED ORDER — FINASTERIDE 1 MG PO TABS: 1.0000 mg | ORAL_TABLET | Freq: Every day | ORAL | 3 refills | Status: DC

## 2022-10-14 NOTE — Progress Notes (Signed)
    Procedures performed today:    None.  Independent interpretation of notes and tests performed by another provider:   None.  Brief History, Exam, Impression, and Recommendations:    Recurrent UTI This is a very pleasant 67 year old male, he returns for treatment of recurrent episodes of prostatitis, he did have a recent urine culture that grew E. coli, he had some Bactrim that helped to some degree for 14 days but had a recurrence of symptoms. The urine culture did show the best MICs with third-generation cephalosporins, we added Omnicef for 28 days and he reports good relief of his symptoms. He will continue Flomax and if he still has having 1-2 episodes of nocturia I would like to add finasteride as well, he can follow this up with his PCP but understands finasteride takes some months time to work.  Neck fullness Jordan Coffey also has some asymmetric left neck fullness, he is a smoker. I do feel what seems to be fullness of his parotid gland on the left, he does have some scattered cervical lymphadenopathy that is nontender but does feel well-rounded and movable. I did suggest a neck ultrasound, declined by patient, he understands risks, benefits, alternatives. He plans to discuss this with his PCP.  Cervical spondylosis Chronic neck pain, right-sided trapezial, adding neck x-rays, meloxicam, home conditioning, return to see me in 6 weeks if needed.    ____________________________________________ Gwen Her. Dianah Field, M.D., ABFM., CAQSM., AME. Primary Care and Sports Medicine [facility]  Adjunct Professor of [place] [facility] of Medicine  Risk manager

## 2022-10-14 NOTE — Assessment & Plan Note (Signed)
This is a very pleasant 67 year old male, he returns for treatment of recurrent episodes of prostatitis, he did have a recent urine culture that grew E. coli, he had some Bactrim that helped to some degree for 14 days but had a recurrence of symptoms. The urine culture did show the best MICs with third-generation cephalosporins, we added Omnicef for 28 days and he reports good relief of his symptoms. He will continue Flomax and if he still has having 1-2 episodes of nocturia I would like to add finasteride as well, he can follow this up with his PCP but understands finasteride takes some months time to work.

## 2022-10-14 NOTE — Assessment & Plan Note (Signed)
Jordan Coffey also has some asymmetric left neck fullness, he is a smoker. I do feel what seems to be fullness of his parotid gland on the left, he does have some scattered cervical lymphadenopathy that is nontender but does feel well-rounded and movable. I did suggest a neck ultrasound, declined by patient, he understands risks, benefits, alternatives. He plans to discuss this with his PCP.

## 2022-10-14 NOTE — Assessment & Plan Note (Signed)
Chronic neck pain, right-sided trapezial, adding neck x-rays, meloxicam, home conditioning, return to see me in 6 weeks if needed.

## 2022-11-25 ENCOUNTER — Ambulatory Visit (INDEPENDENT_AMBULATORY_CARE_PROVIDER_SITE_OTHER): Payer: Medicare Other | Admitting: Family Medicine

## 2022-11-25 ENCOUNTER — Encounter: Payer: Self-pay | Admitting: Family Medicine

## 2022-11-25 VITALS — BP 117/74 | HR 83 | Ht 74.0 in | Wt 161.0 lb

## 2022-11-25 DIAGNOSIS — E782 Mixed hyperlipidemia: Secondary | ICD-10-CM

## 2022-11-25 DIAGNOSIS — R350 Frequency of micturition: Secondary | ICD-10-CM

## 2022-11-25 DIAGNOSIS — Z0000 Encounter for general adult medical examination without abnormal findings: Secondary | ICD-10-CM | POA: Diagnosis not present

## 2022-11-25 DIAGNOSIS — N401 Enlarged prostate with lower urinary tract symptoms: Secondary | ICD-10-CM

## 2022-11-25 DIAGNOSIS — N390 Urinary tract infection, site not specified: Secondary | ICD-10-CM

## 2022-11-25 DIAGNOSIS — I10 Essential (primary) hypertension: Secondary | ICD-10-CM

## 2022-11-25 NOTE — Assessment & Plan Note (Signed)
Continue atorvastatin.   Update lipid panel.  

## 2022-11-25 NOTE — Assessment & Plan Note (Signed)
Update PSA.  Check UA today.  Continue Flomax and finasteride

## 2022-11-25 NOTE — Assessment & Plan Note (Signed)
BP is well controlled with current medication.  He will continue lisinopril/hctz at current strength.

## 2022-11-25 NOTE — Progress Notes (Signed)
Jordan Span Eaddy Sr. - 67 y.o. male MRN [id number]  Date of birth: [birth year]  Subjective Chief Complaint  Patient presents with   Urinary Tract Infection    HPI Jordan B Manzo Sr. Is a 67 y.o. male here today for follow up visit.   He reports that he is doing well.   BP remains well controlled with lisinopril/hctz at current strength.  He denies side effects.  No chest pain, shortness of breath, palpitations, headache or vision changes.   Taking atorvastatin consistently.  Tolerating well.   BPH symptoms are pretty well controlled.  Taking flomax and finasteride regularly.    ROS:  A comprehensive ROS was completed and negative except as noted per HPI  No Known Allergies  Past Medical History:  Diagnosis Date   HLD (hyperlipidemia) 06/15/2018   Hypertension     Past Surgical History:  Procedure Laterality Date   Arm surgery     arthroscopic knee surgery      Social History   Socioeconomic History   Marital status: Married    Spouse name: Tonia Ghent Baham   Number of children: 3   Years of education: 16   Highest education level: Bachelor's degree (e.g., BA, AB, BS)  Occupational History    Comment: Retired  Tobacco Use   Smoking status: Some Days    Types: Cigars   Smokeless tobacco: Current   Tobacco comments:    Occasional use of cigars  Substance and Sexual Activity   Alcohol use: Yes    Alcohol/week: 1.0 standard drink of alcohol    Types: 1 Cans of beer per week    Comment: Occasional   Drug use: No   Sexual activity: Not Currently  Other Topics Concern   Not on file  Social History Narrative   Lives with his wife. Enjoys tracking and playing golf.   Social Determinants of Health   Financial Resource Strain: Low Risk  (11/24/2022)   Overall Financial Resource Strain (CARDIA)    Difficulty of Paying Living Expenses: Not hard at all  Food Insecurity: No Food Insecurity (11/24/2022)   Hunger Vital Sign    Worried About Running Out of Food in the  Last Year: Never true    Ran Out of Food in the Last Year: Never true  Transportation Needs: No Transportation Needs (11/24/2022)   PRAPARE - Administrator, Civil Service (Medical): No    Lack of Transportation (Non-Medical): No  Physical Activity: Sufficiently Active (11/24/2022)   Exercise Vital Sign    Days of Exercise per Week: 7 days    Minutes of Exercise per Session: 60 min  Stress: No Stress Concern Present (11/24/2022)   [employer] of Occupational Health - Occupational Stress Questionnaire    Feeling of Stress : Not at all  Social Connections: Unknown (11/24/2022)   Social Connection and Isolation Panel [NHANES]    Frequency of Communication with Friends and Family: More than three times a week    Frequency of Social Gatherings with Friends and Family: Three times a week    Attends Religious Services: Patient declined    Active Member of Clubs or Organizations: Yes    Attends Engineer, structural: More than 4 times per year    Marital Status: Married    Family History  Problem Relation Age of Onset   Diabetes Mother    Hypertension Mother    Hypertension Father     Health Maintenance  Topic Date Due   COVID-19  Vaccine (5 - 2023-24 season) 05/13/2023 (Originally 04/24/2022)   INFLUENZA VACCINE  03/12/2023   Medicare Annual Wellness (AWV)  04/22/2023   Fecal DNA (Cologuard)  12/11/2024   DTaP/Tdap/Td (2 - Td or Tdap) 04/21/2026   Hepatitis C Screening  Completed   HIV Screening  Completed   Zoster Vaccines- Shingrix  Completed   HPV VACCINES  Aged Out     ----------------------------------------------------------------------------------------------------------------------------------------------------------------------------------------------------------------- Physical Exam BP 117/74 (BP Location: Left Arm, Patient Position: Sitting, Cuff Size: Normal)   Pulse 83   Ht  (1.88 m)   Wt 161 lb (73 kg)   SpO2 99%   BMI 20.67 kg/m    Physical Exam Constitutional:      Appearance: Normal appearance.  HENT:     Head: Normocephalic and atraumatic.  Eyes:     General: No scleral icterus. Cardiovascular:     Rate and Rhythm: Normal rate and regular rhythm.  Pulmonary:     Effort: Pulmonary effort is normal.     Breath sounds: Normal breath sounds.  Neurological:     Mental Status: He is alert.  Psychiatric:        Mood and Affect: Mood normal.        Behavior: Behavior normal.     ------------------------------------------------------------------------------------------------------------------------------------------------------------------------------------------------------------------- Assessment and Plan  HTN (hypertension) BP is well controlled with current medication.  He will continue lisinopril/hctz at current strength.   BPH (benign prostatic hyperplasia) Update PSA.  Check UA today.  Continue Flomax and finasteride  HLD (hyperlipidemia) Continue atorvastatin.  Update lipid panel.    No orders of the defined types were placed in this encounter.   No follow-ups on file.    This visit occurred during the SARS-CoV-2 public health emergency.  Safety protocols were in place, including screening questions prior to the visit, additional usage of staff PPE, and extensive cleaning of exam room while observing appropriate contact time as indicated for disinfecting solutions.

## 2022-11-26 LAB — URINALYSIS, ROUTINE W REFLEX MICROSCOPIC
Bilirubin Urine: NEGATIVE
Nitrite: POSITIVE — AB
pH: 5.5 (ref 5.0–8.0)

## 2022-11-26 LAB — TSH: TSH: 0.97 mIU/L (ref 0.40–4.50)

## 2022-11-27 ENCOUNTER — Encounter: Payer: Self-pay | Admitting: Family Medicine

## 2022-11-27 LAB — URINALYSIS, ROUTINE W REFLEX MICROSCOPIC
Glucose, UA: NEGATIVE
Hyaline Cast: NONE SEEN /LPF
Ketones, ur: NEGATIVE
RBC / HPF: >=60 /HPF — AB (ref 0–2)
Specific Gravity, Urine: 1.024 (ref 1.001–1.035)
Squamous Epithelial / HPF: NONE SEEN /HPF (ref <=5)

## 2022-11-27 LAB — PSA, TOTAL AND FREE
PSA, % Free: 18 % (calc) — ABNORMAL LOW (ref >25)
PSA, Free: 0.2 ng/mL
PSA, Total: 1.1 ng/mL (ref <=4.0)

## 2022-11-27 LAB — CBC WITH DIFFERENTIAL/PLATELET
Absolute Monocytes: 430 cells/uL (ref 200–950)
Basophils Absolute: 60 cells/uL (ref 0–200)
Basophils Relative: 0.7 %
Eosinophils Absolute: 765 cells/uL — ABNORMAL HIGH (ref 15–500)
Eosinophils Relative: 8.9 %
HCT: 39.2 % (ref 38.5–50.0)
Hemoglobin: 13.1 g/dL — ABNORMAL LOW (ref 13.2–17.1)
Lymphs Abs: 1909 cells/uL (ref 850–3900)
MCH: 30.6 pg (ref 27.0–33.0)
MCHC: 33.4 g/dL (ref 32.0–36.0)
MCV: 91.6 fL (ref 80.0–100.0)
MPV: 10.6 fL (ref 7.5–12.5)
Monocytes Relative: 5 %
Neutro Abs: 5435 cells/uL (ref 1500–7800)
Neutrophils Relative %: 63.2 %
Platelets: 229 10*3/uL (ref 140–400)
RBC: 4.28 10*6/uL (ref 4.20–5.80)
RDW: 14.2 % (ref 11.0–15.0)
Total Lymphocyte: 22.2 %
WBC: 8.6 10*3/uL (ref 3.8–10.8)

## 2022-11-27 LAB — COMPLETE METABOLIC PANEL WITH GFR
AG Ratio: 1.2 (calc) (ref 1.0–2.5)
ALT: 13 U/L (ref 9–46)
AST: 20 U/L (ref 10–35)
Albumin: 3.8 g/dL (ref 3.6–5.1)
Alkaline phosphatase (APISO): 70 U/L (ref 35–144)
BUN: 15 mg/dL (ref 7–25)
CO2: 23 mmol/L (ref 20–32)
Calcium: 8.9 mg/dL (ref 8.6–10.3)
Chloride: 105 mmol/L (ref 98–110)
Creat: 0.99 mg/dL (ref 0.70–1.35)
Globulin: 3.1 g/dL (calc) (ref 1.9–3.7)
Glucose, Bld: 89 mg/dL (ref 65–99)
Potassium: 4.3 mmol/L (ref 3.5–5.3)
Sodium: 137 mmol/L (ref 135–146)
Total Bilirubin: 0.3 mg/dL (ref 0.2–1.2)
Total Protein: 6.9 g/dL (ref 6.1–8.1)
eGFR: 85 mL/min/{1.73_m2} (ref >=60)

## 2022-11-27 LAB — LIPID PANEL W/REFLEX DIRECT LDL
Cholesterol: 114 mg/dL (ref <200)
HDL: 61 mg/dL (ref >=40)
LDL Cholesterol (Calc): 36 mg/dL (calc)
Non-HDL Cholesterol (Calc): 53 mg/dL (calc) (ref <130)
Total CHOL/HDL Ratio: 1.9 (calc) (ref <5.0)
Triglycerides: 91 mg/dL (ref <150)

## 2022-11-28 ENCOUNTER — Other Ambulatory Visit: Payer: Self-pay | Admitting: Family Medicine

## 2022-11-28 DIAGNOSIS — N390 Urinary tract infection, site not specified: Secondary | ICD-10-CM

## 2022-12-01 DIAGNOSIS — N390 Urinary tract infection, site not specified: Secondary | ICD-10-CM | POA: Diagnosis not present

## 2022-12-03 LAB — URINE CULTURE
MICRO NUMBER:: 14857051
SPECIMEN QUALITY:: ADEQUATE

## 2022-12-05 ENCOUNTER — Encounter: Payer: Self-pay | Admitting: Family Medicine

## 2022-12-05 MED ORDER — NITROFURANTOIN MONOHYD MACRO 100 MG PO CAPS: 100.0000 mg | ORAL_CAPSULE | Freq: Two times a day (BID) | ORAL | 0 refills | Status: AC

## 2022-12-08 ENCOUNTER — Telehealth: Payer: Self-pay

## 2022-12-08 NOTE — Telephone Encounter (Signed)
Pt lvm concerning UTI medication. Dr. Ashley Royalty address in MyChart.

## 2022-12-11 ENCOUNTER — Other Ambulatory Visit: Payer: Self-pay | Admitting: Family Medicine

## 2022-12-11 DIAGNOSIS — I10 Essential (primary) hypertension: Secondary | ICD-10-CM

## 2023-02-10 ENCOUNTER — Other Ambulatory Visit: Payer: Self-pay | Admitting: Sports Medicine

## 2023-02-10 DIAGNOSIS — M47812 Spondylosis without myelopathy or radiculopathy, cervical region: Secondary | ICD-10-CM

## 2023-04-11 ENCOUNTER — Other Ambulatory Visit: Payer: Self-pay | Admitting: Family Medicine

## 2023-04-27 ENCOUNTER — Encounter: Payer: Self-pay | Admitting: Family Medicine

## 2023-04-27 ENCOUNTER — Telehealth: Payer: Self-pay | Admitting: General Practice

## 2023-04-27 ENCOUNTER — Ambulatory Visit (INDEPENDENT_AMBULATORY_CARE_PROVIDER_SITE_OTHER): Payer: Medicare Other | Admitting: Family Medicine

## 2023-04-27 DIAGNOSIS — Z0000 Encounter for general adult medical examination without abnormal findings: Secondary | ICD-10-CM

## 2023-04-27 MED ORDER — TAMSULOSIN HCL 0.4 MG PO CAPS: 0.4000 mg | ORAL_CAPSULE | Freq: Every day | ORAL | 1 refills | Status: DC

## 2023-04-27 NOTE — Progress Notes (Signed)
MEDICARE ANNUAL WELLNESS VISIT  04/27/2023  Telephone Visit Disclaimer This Medicare AWV was conducted by telephone due to national recommendations for restrictions regarding the COVID-19 Pandemic (e.g. social distancing).  I verified, using two identifiers, that I am speaking with Jordan Harbor B Mannes Sr. or their authorized healthcare agent. I discussed the limitations, risks, security, and privacy concerns of performing an evaluation and management service by telephone and the potential availability of an in-person appointment in the future. The patient expressed understanding and agreed to proceed.  Location of Patient: Home Location of Provider (nurse):  In the office.  Subjective:    Jordan B Matus Sr. is a 67 y.o. male patient of Everrett Coombe, DO who had a Medicare Annual Wellness Visit today via telephone. [place] is Retired and lives with their spouse. he has 3 children. he reports that he is socially active and does interact with friends/family regularly. he is moderately physically active and enjoys fishing, playing golf.  Patient Care Team: Everrett Coombe, DO as PCP - General (Family Medicine)     04/27/2023   10:04 AM 04/21/2022   10:11 AM 04/21/2016    3:00 PM 04/16/2016    7:17 PM  Advanced Directives  Does Patient Have a Medical Advance Directive? No No No No  Would patient like information on creating a medical advance directive? No - Patient declined No - Patient declined No - patient declined information Yes - Educational materials given    Hospital Utilization Over the Past 12 Months: # of hospitalizations or ER visits: 0 # of surgeries: 0  Review of Systems    Patient reports that his overall health is unchanged compared to last year.  History obtained from chart review and the patient  Patient Reported Readings (BP, Pulse, CBG, Weight, etc) none Per patient no change in vitals since last visit, unable to obtain new vitals due to telehealth visit  Pain  Assessment Pain : No/denies pain     Current Medications & Allergies (verified) Allergies as of 04/27/2023   No Known Allergies      Medication List        Accurate as of April 27, 2023 10:19 AM. If you have any questions, ask your nurse or doctor.          atorvastatin 40 MG tablet Commonly known as: LIPITOR TAKE 1 TABLET BY MOUTH EVERY DAY   finasteride 1 MG tablet Commonly known as: PROPECIA Take 1 tablet (1 mg total) by mouth daily.   lisinopril-hydrochlorothiazide 20-12.5 MG tablet Commonly known as: ZESTORETIC TAKE 1 TABLET BY MOUTH EVERY DAY   meloxicam 15 MG tablet Commonly known as: MOBIC TAKE ONE TAB BY MOUTH EVERY 24 HOURS WITH A MEAL FOR 2 WEEKS, THEN ONCE DAILY AS NEEDED FOR PAIN   multivitamin with minerals Tabs tablet Take 1 tablet by mouth daily.   tamsulosin 0.4 MG Caps capsule Commonly known as: FLOMAX TAKE 1 CAPSULE BY MOUTH EVERY DAY        History (reviewed): Past Medical History:  Diagnosis Date   HLD (hyperlipidemia) 06/15/2018   Hypertension    Past Surgical History:  Procedure Laterality Date   Arm surgery     arthroscopic knee surgery     Family History  Problem Relation Age of Onset   Diabetes Mother    Hypertension Mother    Hypertension Father    Social History   Socioeconomic History   Marital status: Married    Spouse name: Tonia Ghent Rossy  Number of children: 3   Years of education: 16   Highest education level: Bachelor's degree (e.g., BA, AB, BS)  Occupational History    Comment: Retired  Tobacco Use   Smoking status: Some Days    Types: Cigars   Smokeless tobacco: Current   Tobacco comments:    Occasional use of cigars  Vaping Use   Vaping status: Never Used  Substance and Sexual Activity   Alcohol use: Yes    Alcohol/week: 1.0 standard drink of alcohol    Types: 1 Cans of beer per week    Comment: Occasional   Drug use: No   Sexual activity: Not Currently  Other Topics Concern   Not  on file  Social History Narrative   Lives with his wife. Enjoys fishing, playing golf.   Social Determinants of Health   Financial Resource Strain: Low Risk  (04/27/2023)   Overall Financial Resource Strain (CARDIA)    Difficulty of Paying Living Expenses: Not hard at all  Food Insecurity: No Food Insecurity (11/24/2022)   Hunger Vital Sign    Worried About Running Out of Food in the Last Year: Never true    Ran Out of Food in the Last Year: Never true  Transportation Needs: No Transportation Needs (04/27/2023)   PRAPARE - Administrator, Civil Service (Medical): No    Lack of Transportation (Non-Medical): No  Physical Activity: Sufficiently Active (04/27/2023)   Exercise Vital Sign    Days of Exercise per Week: 7 days    Minutes of Exercise per Session: 50 min  Stress: No Stress Concern Present (04/27/2023)   [employer] of Occupational Health - Occupational Stress Questionnaire    Feeling of Stress : Only a little  Social Connections: Moderately Isolated (04/27/2023)   Social Connection and Isolation Panel [NHANES]    Frequency of Communication with Friends and Family: More than three times a week    Frequency of Social Gatherings with Friends and Family: More than three times a week    Attends Religious Services: Never    Database administrator or Organizations: No    Attends Banker Meetings: Never    Marital Status: Married    Activities of Daily Living    04/27/2023   10:07 AM  In your present state of health, do you have any difficulty performing the following activities:  Hearing? 0  Vision? 0  Difficulty concentrating or making decisions? 0  Walking or climbing stairs? 0  Dressing or bathing? 0  Doing errands, shopping? 0  Preparing Food and eating ? N  Using the Toilet? N  In the past six months, have you accidently leaked urine? N  Do you have problems with loss of bowel control? N  Managing your Medications? N  Managing your  Finances? N  Housekeeping or managing your Housekeeping? N    Patient Education/ Literacy How often do you need to have someone help you when you read instructions, pamphlets, or other written materials from your doctor or pharmacy?: 1 - Never What is the last grade level you completed in school?: Bachelor's degree  Exercise    Diet Patient reports consuming 4 meals a day and 0 snack(s) a day Patient reports that his primary diet is: Regular Patient reports that she does have regular access to food.   Depression Screen    04/27/2023   10:04 AM 11/25/2022   10:11 AM 04/21/2022   10:15 AM 11/22/2021   10:46 AM 02/13/2021  10:18 AM 02/12/2021   11:09 AM 09/28/2020   10:09 AM  PHQ 2/9 Scores  PHQ - 2 Score 1 0 0 0 0  0     Information is confidential and restricted. Go to Review Flowsheets to unlock data.     Fall Risk    04/27/2023   10:04 AM 11/25/2022   10:11 AM 04/21/2022   10:14 AM 11/22/2021   10:46 AM 09/28/2020   10:09 AM  Fall Risk   Falls in the past year? 0 0 0 0 0  Number falls in past yr: 0 0 0 0 0  Injury with Fall? 0 0 0 0 0  Risk for fall due to : No Fall Risks No Fall Risks No Fall Risks No Fall Risks No Fall Risks  Follow up Falls evaluation completed Falls evaluation completed Falls evaluation completed Falls evaluation completed Falls evaluation completed     Objective:  Shepard B Bittick Sr. seemed alert and oriented and he participated appropriately during our telephone visit.  Blood Pressure Weight BMI  BP Readings from Last 3 Encounters:  11/25/22 117/74  10/14/22 125/78  09/16/22 116/76   Wt Readings from Last 3 Encounters:  11/25/22 161 lb (73 kg)  09/16/22 161 lb 0.6 oz (73 kg)  05/26/22 160 lb 4 oz (72.7 kg)   BMI Readings from Last 1 Encounters:  11/25/22 20.67 kg/m    *Unable to obtain current vital signs, weight, and BMI due to telephone visit type  Hearing/Vision  Kamauri did not seem to have difficulty with hearing/understanding during  the telephone conversation Reports that he has not had a formal eye exam by an eye care professional within the past year Reports that he has not had a formal hearing evaluation within the past year *Unable to fully assess hearing and vision during telephone visit type  Cognitive Function:    04/27/2023   10:11 AM 04/21/2022   10:19 AM  6CIT Screen  What Year? 0 points 0 points  What month? 0 points 0 points  What time? 0 points 0 points  Count back from 20 0 points 0 points  Months in reverse 0 points 0 points  Repeat phrase 2 points 2 points  Total Score 2 points 2 points   (Normal:0-7, Significant for Dysfunction: >8)  Normal Cognitive Function Screening: Yes   Immunization & Health Maintenance Record Immunization History  Administered Date(s) Administered   Influenza,inj,Quad PF,6+ Mos 04/21/2016, 06/24/2017, 06/15/2018, 04/19/2019, 07/02/2020, 05/26/2022   PFIZER(Purple Top)SARS-COV-2 Vaccination 10/25/2019, 11/15/2019, 05/11/2020   Pfizer Covid-19 Vaccine Bivalent Booster 56yrs & up 02/27/2022   Tdap 04/21/2016   Zoster Recombinant(Shingrix) 08/29/2019, 02/27/2022    Health Maintenance  Topic Date Due   COVID-19 Vaccine (5 - 2023-24 season) 05/13/2023 (Originally 04/12/2023)   INFLUENZA VACCINE  11/09/2023 (Originally 03/12/2023)   Pneumonia Vaccine 47+ Years old (1 of 2 - PCV) 04/26/2024 (Originally 03/15/1962)   Medicare Annual Wellness (AWV)  04/26/2024   Fecal DNA (Cologuard)  12/11/2024   DTaP/Tdap/Td (2 - Td or Tdap) 04/21/2026   Hepatitis C Screening  Completed   HIV Screening  Completed   Zoster Vaccines- Shingrix  Completed   HPV VACCINES  Aged Out       Assessment  This is a routine wellness examination for Isiaah B Pribyl CoffeyMarland Kitchen  Health Maintenance: Due or Overdue There are no preventive care reminders to display for this patient.   Shahzad B Dibbern Sr. does not need a referral for Community Assistance: Care Management:  no Social  Work:    no Prescription Assistance:  yes Nutrition/Diabetes Education:  no   Plan:  Personalized Goals  Goals Addressed               This Visit's Progress     Patient Stated (pt-stated)        Patient stated that he would like to stay active for the grand kids.       Personalized Health Maintenance & Screening Recommendations  Pneumococcal vaccine  Influenza vaccine  Lung Cancer Screening Recommended: no (Low Dose CT Chest recommended if Age 66-80 years, 20 pack-year currently smoking OR have quit w/in past 15 years) Hepatitis C Screening recommended: no HIV Screening recommended: no  Advanced Directives: Written information was not prepared per patient's request.  Referrals & Orders Orders Placed This Encounter  Procedures   AMB Referral to Community Care Coordinaton ([facility] Patients)    Follow-up Plan Follow-up with Everrett Coombe, DO as planned Schedule influenza  and pneumonia vaccine at the pharmacy or in office. Medicare wellness visit in one year.  Patient will access AVS on my chart.   I have personally reviewed and noted the following in the patient's chart:   Medical and social history Use of alcohol, tobacco or illicit drugs  Current medications and supplements Functional ability and status Nutritional status Physical activity Advanced directives List of other physicians Hospitalizations, surgeries, and ER visits in previous 12 months Vitals Screenings to include cognitive, depression, and falls Referrals and appointments  In addition, I have reviewed and discussed with Kidus B Trindade Sr. certain preventive protocols, quality metrics, and best practice recommendations. A written personalized care plan for preventive services as well as general preventive health recommendations is available and can be mailed to the patient at his request.      Modesto Charon, RN BSN  04/27/2023

## 2023-04-27 NOTE — Telephone Encounter (Signed)
Patient called stating that he received his medication.

## 2023-04-27 NOTE — Telephone Encounter (Signed)
Patient called again to check on his prescriptions if they had been sent. I advised the patient that they require 24-48 hours. He is completely out of tamsulosin is wondering if something can be sent right away to [facility].   Thank you.

## 2023-04-27 NOTE — Telephone Encounter (Signed)
Medication sent to [facility] per patient request

## 2023-04-27 NOTE — Addendum Note (Signed)
Addended by: Chalmers Cater on: 04/27/2023 03:28 PM   Modules accepted: Orders

## 2023-04-27 NOTE — Telephone Encounter (Signed)
Patient stated during his AWV that he has a new pharmacy and that he needs all of his prescriptions sent to [facility] instead of [facility]. He said he tried to call Friday but wasn't able to get anyone.   Thank you.

## 2023-04-27 NOTE — Telephone Encounter (Signed)
Medication sent to [facility] per patient request 

## 2023-04-27 NOTE — Patient Instructions (Addendum)
MEDICARE ANNUAL WELLNESS VISIT Health Maintenance Summary and Written Plan of Care  Jordan Coffey ,  Thank you for allowing me to perform your Medicare Annual Wellness Visit and for your ongoing commitment to your health.   Health Maintenance & Immunization History Health Maintenance  Topic Date Due   COVID-19 Vaccine (5 - 2023-24 season) 05/13/2023 (Originally 04/12/2023)   INFLUENZA VACCINE  11/09/2023 (Originally 03/12/2023)   Pneumonia Vaccine 6+ Years old (1 of 2 - PCV) 04/26/2024 (Originally 03/15/1964)   Medicare Annual Wellness (AWV)  04/26/2024   Fecal DNA (Cologuard)  12/11/2024   DTaP/Tdap/Td (2 - Td or Tdap) 04/21/2026   Hepatitis C Screening  Completed   HIV Screening  Completed   Zoster Vaccines- Shingrix  Completed   HPV VACCINES  Aged Out   Immunization History  Administered Date(s) Administered   Influenza,inj,Quad PF,6+ Mos 04/21/2016, 06/24/2017, 06/15/2018, 04/19/2019, 07/02/2020, 05/26/2022   PFIZER(Purple Top)SARS-COV-2 Vaccination 10/25/2019, 11/15/2019, 05/11/2020   Pfizer Covid-19 Vaccine Bivalent Booster 50yrs & up 02/27/2022   Tdap 04/21/2016   Zoster Recombinant(Shingrix) 08/29/2019, 02/27/2022    These are the patient goals that we discussed:  Goals Addressed               This Visit's Progress     Patient Stated (pt-stated)        Patient stated that he would like to stay active for the grand kids.         This is a list of Health Maintenance Items that are overdue or due now: Pneumococcal vaccine  Influenza vaccine    Orders/Referrals Placed Today: Orders Placed This Encounter  Procedures   AMB Referral to Community Care Coordinaton ([facility] Patients)    Referral Priority:   Routine    Referral Type:   Consultation    Referral Reason:   Care Coordination    Number of Visits Requested:   1   (Contact our referral department at [phone] if you have not spoken with someone about your referral appointment within the next 5 days)     Follow-up Plan Follow-up with Everrett Coombe, DO as planned Schedule influenza  and pneumonia vaccine at the pharmacy or in office. Medicare wellness visit in one year.  Patient will access AVS on my chart.      Health Maintenance, Male Adopting a healthy lifestyle and getting preventive care are important in promoting health and wellness. Ask your health care provider about: The right schedule for you to have regular tests and exams. Things you can do on your own to prevent diseases and keep yourself healthy. What should I know about diet, weight, and exercise? Eat a healthy diet  Eat a diet that includes plenty of vegetables, fruits, low-fat dairy products, and lean protein. Do not eat a lot of foods that are high in solid fats, added sugars, or sodium. Maintain a healthy weight Body mass index (BMI) is a measurement that can be used to identify possible weight problems. It estimates body fat based on height and weight. Your health care provider can help determine your BMI and help you achieve or maintain a healthy weight. Get regular exercise Get regular exercise. This is one of the most important things you can do for your health. Most adults should: Exercise for at least 150 minutes each week. The exercise should increase your heart rate and make you sweat (moderate-intensity exercise). Do strengthening exercises at least twice a week. This is in addition to the moderate-intensity exercise. Spend less time sitting. Even  light physical activity can be beneficial. Watch cholesterol and blood lipids Have your blood tested for lipids and cholesterol at 65 years of age, then have this test every 5 years. You may need to have your cholesterol levels checked more often if: Your lipid or cholesterol levels are high. You are older than 65 years of age. You are at high risk for heart disease. What should I know about cancer screening? Many types of cancers can be detected early and  may often be prevented. Depending on your health history and family history, you may need to have cancer screening at various ages. This may include screening for: Colorectal cancer. Prostate cancer. Skin cancer. Lung cancer. What should I know about heart disease, diabetes, and high blood pressure? Blood pressure and heart disease High blood pressure causes heart disease and increases the risk of stroke. This is more likely to develop in people who have high blood pressure readings or are overweight. Talk with your health care provider about your target blood pressure readings. Have your blood pressure checked: Every 3-5 years if you are 64-80 years of age. Every year if you are 72 years old or older. If you are between the ages of 31 and 46 and are a current or former smoker, ask your health care provider if you should have a one-time screening for abdominal aortic aneurysm (AAA). Diabetes Have regular diabetes screenings. This checks your fasting blood sugar level. Have the screening done: Once every three years after age 65 if you are at a normal weight and have a low risk for diabetes. More often and at a younger age if you are overweight or have a high risk for diabetes. What should I know about preventing infection? Hepatitis B If you have a higher risk for hepatitis B, you should be screened for this virus. Talk with your health care provider to find out if you are at risk for hepatitis B infection. Hepatitis C Blood testing is recommended for: Everyone born from 18 through 1965. Anyone with known risk factors for hepatitis C. Sexually transmitted infections (STIs) You should be screened each year for STIs, including gonorrhea and chlamydia, if: You are sexually active and are younger than 65 years of age. You are older than 65 years of age and your health care provider tells you that you are at risk for this type of infection. Your sexual activity has changed since you were  last screened, and you are at increased risk for chlamydia or gonorrhea. Ask your health care provider if you are at risk. Ask your health care provider about whether you are at high risk for HIV. Your health care provider may recommend a prescription medicine to help prevent HIV infection. If you choose to take medicine to prevent HIV, you should first get tested for HIV. You should then be tested every 3 months for as long as you are taking the medicine. Follow these instructions at home: Alcohol use Do not drink alcohol if your health care provider tells you not to drink. If you drink alcohol: Limit how much you have to 0-2 drinks a day. Know how much alcohol is in your drink. In the [place] one drink equals one 12 oz bottle of beer (355 mL), one 5 oz glass of wine (148 mL), or one 1 oz glass of hard liquor (44 mL). Lifestyle Do not use any products that contain nicotine or tobacco. These products include cigarettes, chewing tobacco, and vaping devices, such as e-cigarettes. If you  need help quitting, ask your health care provider. Do not use street drugs. Do not share needles. Ask your health care provider for help if you need support or information about quitting drugs. General instructions Schedule regular health, dental, and eye exams. Stay current with your vaccines. Tell your health care provider if: You often feel depressed. You have ever been abused or do not feel safe at home. Summary Adopting a healthy lifestyle and getting preventive care are important in promoting health and wellness. Follow your health care provider's instructions about healthy diet, exercising, and getting tested or screened for diseases. Follow your health care provider's instructions on monitoring your cholesterol and blood pressure. This information is not intended to replace advice given to you by your health care provider. Make sure you discuss any questions you have with your health care  provider. Document Revised: 12/17/2020 Document Reviewed: 12/17/2020 Elsevier Patient Education  2024 [employer].

## 2023-04-28 ENCOUNTER — Telehealth: Payer: Self-pay

## 2023-04-28 NOTE — Progress Notes (Signed)
Care Guide Note  04/28/2023 Name: Jordan Hyder Mies Sr. MRN: [id number] DOB: 24-Dec-1955  Referred by: Everrett Coombe, DO Reason for referral : Care Coordination (Outreach to schedule with Pharm d med assistance )   Jordan B Traughber Sr. is a 67 y.o. year old male who is a primary care patient of Everrett Coombe, DO. Jordan B Pegues Sr. was referred to the pharmacist for assistance related to HTN and HLD.    Successful contact was made with the patient to discuss pharmacy services. Patient declines engagement at this time. Contact information was provided to the patient should they wish to reach out for assistance at a later time.  Penne Lash, RMA Care Guide [facility]  [place], [place] [address] Direct Dial: [phone] [email or web address]

## 2023-04-28 NOTE — Progress Notes (Signed)
Care Guide Note  04/28/2023 Name: Jordan Vidovich Mendenhall Sr. MRN: [id number] DOB: Jul 17, 1956  Referred by: Everrett Coombe, DO Reason for referral : Care Coordination (Outreach to schedule with Pharm d med assistance )   Jordan B Patella Sr. is a 67 y.o. year old male who is a primary care patient of Everrett Coombe, DO. Jordan B Duchesne Sr. was referred to the pharmacist for assistance related to HTN and HLD.    An unsuccessful telephone outreach was attempted today to contact the patient who was referred to the pharmacy team for assistance with medication assistance. Additional attempts will be made to contact the patient.   Penne Lash, RMA Care Guide [facility]  [place], [place] [address] Direct Dial: [phone] [email or web address]

## 2023-05-18 ENCOUNTER — Encounter: Payer: Self-pay | Admitting: Medical-Surgical

## 2023-05-18 ENCOUNTER — Ambulatory Visit (INDEPENDENT_AMBULATORY_CARE_PROVIDER_SITE_OTHER): Payer: Medicare Other | Admitting: Medical-Surgical

## 2023-05-18 VITALS — BP 97/59 | HR 73 | Resp 20 | Ht 74.0 in | Wt 152.1 lb

## 2023-05-18 DIAGNOSIS — N390 Urinary tract infection, site not specified: Secondary | ICD-10-CM | POA: Diagnosis not present

## 2023-05-18 DIAGNOSIS — Z23 Encounter for immunization: Secondary | ICD-10-CM

## 2023-05-18 DIAGNOSIS — R1013 Epigastric pain: Secondary | ICD-10-CM

## 2023-05-18 LAB — POCT URINALYSIS DIP (CLINITEK)
Bilirubin, UA: NEGATIVE
Glucose, UA: NEGATIVE mg/dL
Ketones, POC UA: NEGATIVE mg/dL
Nitrite, UA: POSITIVE — AB
POC PROTEIN,UA: 30 — AB
Spec Grav, UA: >=1.03 — AB (ref 1.010–1.025)
Urobilinogen, UA: 0.2 U/dL
pH, UA: 5.5 (ref 5.0–8.0)

## 2023-05-18 MED ORDER — NITROFURANTOIN MONOHYD MACRO 100 MG PO CAPS
100.0000 mg | ORAL_CAPSULE | Freq: Two times a day (BID) | ORAL | 0 refills | Status: DC
Start: 2023-05-18 — End: 2024-05-04

## 2023-05-18 MED ORDER — PANTOPRAZOLE SODIUM 40 MG PO TBEC: 40.0000 mg | DELAYED_RELEASE_TABLET | Freq: Every day | ORAL | 3 refills | Status: AC

## 2023-05-18 NOTE — Progress Notes (Signed)
        Established patient visit  History, exam, impression, and plan:  1. Epigastric pain Pleasant 67 year old male presenting today with reports of epigastric pain that is been going on for about a month.  Notes that September was very stressful for him and he had multiple issues to manage.  Has noted that there is now a burning in the epigastric area that is worse with certain foods and with bending over.  Denies nausea, vomiting, diarrhea, constipation, melena, hematochezia, and hematemesis.  Has not tried any medications over-the-counter.  Abdominal exam benign without significant tenderness, distention.  Bowel sounds positive x 4.  Checking labs as below.  Checking H. pylori.  Adding Protonix 40 mg daily. - H. pylori breath test - CBC - CMP14+EGFR - Lipase - Amylase  2. Recurrent UTI History of recurrent UTIs and has seen a specialist for this.  Unfortunately they told him he would have to live with it and only recommended treatment when he has symptoms.  Notes that he has recurrent issues with burning, frequency, and urgency.  POCT urinalysis positive for nitrites and large leukocytes with moderate blood.  Sending for culture.  Plan to treat with nitrofurantoin twice daily x 5 days to allow culture results to come back and discuss need for switch of antibiotic therapy. - POCT URINALYSIS DIP (CLINITEK) - Urine Culture - nitrofurantoin, macrocrystal-monohydrate, (MACROBID) 100 MG capsule; Take 1 capsule (100 mg total) by mouth 2 (two) times daily.  Dispense: 10 capsule; Refill: 0  3. Need for influenza vaccination Flu vaccine given in office today. - Flu Vaccine Trivalent High Dose (Fluad)   Procedures performed this visit: None.  Return if symptoms worsen or fail to improve.  __________________________________ Thayer Ohm, DNP, APRN, FNP-BC Primary Care and Sports Medicine [facility]

## 2023-05-19 LAB — CMP14+EGFR
ALT: 14 [IU]/L (ref 0–44)
AST: 19 [IU]/L (ref 0–40)
Albumin: 4 g/dL (ref 3.9–4.9)
Alkaline Phosphatase: 73 [IU]/L (ref 44–121)
BUN/Creatinine Ratio: 17 (ref 10–24)
BUN: 16 mg/dL (ref 8–27)
Bilirubin Total: 0.3 mg/dL (ref 0.0–1.2)
CO2: 24 mmol/L (ref 20–29)
Calcium: 9.4 mg/dL (ref 8.6–10.2)
Chloride: 103 mmol/L (ref 96–106)
Creatinine, Ser: 0.94 mg/dL (ref 0.76–1.27)
Globulin, Total: 3.4 g/dL (ref 1.5–4.5)
Glucose: 79 mg/dL (ref 70–99)
Potassium: 4.3 mmol/L (ref 3.5–5.2)
Sodium: 141 mmol/L (ref 134–144)
Total Protein: 7.4 g/dL (ref 6.0–8.5)
eGFR: 90 mL/min/{1.73_m2} (ref >59)

## 2023-05-19 LAB — H. PYLORI BREATH TEST: H pylori Breath Test: NEGATIVE

## 2023-05-19 LAB — AMYLASE: Amylase: 145 U/L — ABNORMAL HIGH (ref 31–110)

## 2023-05-19 LAB — CBC
Hematocrit: 41 % (ref 37.5–51.0)
Hemoglobin: 13.5 g/dL (ref 13.0–17.7)
MCH: 31.2 pg (ref 26.6–33.0)
MCHC: 32.9 g/dL (ref 31.5–35.7)
MCV: 95 fL (ref 79–97)
Platelets: 223 10*3/uL (ref 150–450)
RBC: 4.33 x10E6/uL (ref 4.14–5.80)
RDW: 13.6 % (ref 11.6–15.4)
WBC: 8.4 10*3/uL (ref 3.4–10.8)

## 2023-05-19 LAB — LIPASE: Lipase: 33 U/L (ref 13–78)

## 2023-05-21 LAB — URINE CULTURE

## 2023-05-21 MED ORDER — AMOXICILLIN-POT CLAVULANATE 875-125 MG PO TABS: 1.0000 | ORAL_TABLET | Freq: Two times a day (BID) | ORAL | 0 refills | Status: DC

## 2023-05-21 NOTE — Addendum Note (Signed)
Addended byChristen Butter on: 05/21/2023 02:00 PM   Modules accepted: Orders

## 2023-05-27 ENCOUNTER — Encounter: Payer: Self-pay | Admitting: Family Medicine

## 2023-05-27 ENCOUNTER — Ambulatory Visit (INDEPENDENT_AMBULATORY_CARE_PROVIDER_SITE_OTHER): Payer: Medicare Other | Admitting: Family Medicine

## 2023-05-27 VITALS — BP 114/70 | HR 87 | Ht 74.0 in | Wt 154.0 lb

## 2023-05-27 DIAGNOSIS — I10 Essential (primary) hypertension: Secondary | ICD-10-CM

## 2023-05-27 DIAGNOSIS — N390 Urinary tract infection, site not specified: Secondary | ICD-10-CM | POA: Diagnosis not present

## 2023-05-27 DIAGNOSIS — F419 Anxiety disorder, unspecified: Secondary | ICD-10-CM | POA: Insufficient documentation

## 2023-05-27 DIAGNOSIS — R1013 Epigastric pain: Secondary | ICD-10-CM | POA: Insufficient documentation

## 2023-05-27 MED ORDER — ESCITALOPRAM OXALATE 10 MG PO TABS: ORAL_TABLET | ORAL | 3 refills | Status: DC

## 2023-05-27 NOTE — Assessment & Plan Note (Signed)
BP is well controlled with current medication.  He will continue lisinopril/hctz at current strength.

## 2023-05-27 NOTE — Assessment & Plan Note (Signed)
Continue protonix for now.  Follow up in 1 month.  Discussed red flags and precautions.  Contact clinic if worsening.

## 2023-05-27 NOTE — Progress Notes (Signed)
Jordan B Roselle Sr. - 67 y.o. male MRN [id number]  Date of birth: April 29, 1956  Subjective Chief Complaint  Patient presents with   Hypertension    HPI Jordan B Voyles Sr. Is a 67 y.o. male here today for follow up visit.   Recently seen in our clinic with complaint of Episgastric pain.  Had work up including Breath test, cmp, lipase and amylase.  Started on protonix 40mg  daily.  Only taking for about 1 week. Reports that pain is still present.  It does come and go. Bowels moving pretty normally.  Admits that he he been dealing with a lot of stress recently and this seems to affect his pain.  He would like to start something to help with the stress and anxiety he is experiencing.   He also had UTI at this visit. Initially treated with macrobid, however bacteria was resistant to this.  Changed to augmentin to complete course.  Reports that he is improving.  Still has a few days of antibiotics left.   His BP is well controlled.  Remains on lisinopril/hydrochlorothiazide.  Denies side effects.  Has not had chest pain, shortness of breath, palpitations, headache or vision changes.     ROS:  A comprehensive ROS was completed and negative except as noted per HPI  No Known Allergies  Past Medical History:  Diagnosis Date   HLD (hyperlipidemia) 06/15/2018   Hypertension     Past Surgical History:  Procedure Laterality Date   Arm surgery     arthroscopic knee surgery      Social History   Socioeconomic History   Marital status: Married    Spouse name: Tonia Ghent Hirata   Number of children: 3   Years of education: 16   Highest education level: Bachelor's degree (e.g., BA, AB, BS)  Occupational History    Comment: Retired  Tobacco Use   Smoking status: Some Days    Types: Cigars   Smokeless tobacco: Current   Tobacco comments:    Occasional use of cigars  Vaping Use   Vaping status: Never Used  Substance and Sexual Activity   Alcohol use: Yes    Alcohol/week: 1.0 standard  drink of alcohol    Types: 1 Cans of beer per week    Comment: Occasional   Drug use: No   Sexual activity: Not Currently  Other Topics Concern   Not on file  Social History Narrative   Lives with his wife. Enjoys fishing, playing golf.   Social Determinants of Health   Financial Resource Strain: Low Risk  (05/26/2023)   Overall Financial Resource Strain (CARDIA)    Difficulty of Paying Living Expenses: Not hard at all  Food Insecurity: No Food Insecurity (05/26/2023)   Hunger Vital Sign    Worried About Running Out of Food in the Last Year: Never true    Ran Out of Food in the Last Year: Never true  Transportation Needs: No Transportation Needs (05/26/2023)   PRAPARE - Administrator, Civil Service (Medical): No    Lack of Transportation (Non-Medical): No  Physical Activity: Sufficiently Active (05/26/2023)   Exercise Vital Sign    Days of Exercise per Week: 6 days    Minutes of Exercise per Session: 30 min  Stress: No Stress Concern Present (05/26/2023)   [employer] of Occupational Health - Occupational Stress Questionnaire    Feeling of Stress : Only a little  Social Connections: Unknown (05/26/2023)   Social Connection and Isolation Panel [NHANES]  Frequency of Communication with Friends and Family: More than three times a week    Frequency of Social Gatherings with Friends and Family: Three times a week    Attends Religious Services: Patient declined    Active Member of Clubs or Organizations: Yes    Attends Banker Meetings: Patient declined    Marital Status: Married  Recent Concern: Social Connections - Moderately Isolated (04/27/2023)   Social Connection and Isolation Panel [NHANES]    Frequency of Communication with Friends and Family: More than three times a week    Frequency of Social Gatherings with Friends and Family: More than three times a week    Attends Religious Services: Never    Database administrator or Organizations: No     Attends Engineer, structural: Never    Marital Status: Married    Family History  Problem Relation Age of Onset   Diabetes Mother    Hypertension Mother    Hypertension Father     Health Maintenance  Topic Date Due   COVID-19 Vaccine (5 - 2023-24 season) 06/03/2023 (Originally 04/12/2023)   Pneumonia Vaccine 24+ Years old (1 of 2 - PCV) 04/26/2024 (Originally 03/15/1964)   Medicare Annual Wellness (AWV)  04/26/2024   Fecal DNA (Cologuard)  12/11/2024   DTaP/Tdap/Td (2 - Td or Tdap) 04/21/2026   INFLUENZA VACCINE  Completed   Hepatitis C Screening  Completed   HIV Screening  Completed   Zoster Vaccines- Shingrix  Completed   HPV VACCINES  Aged Out     ----------------------------------------------------------------------------------------------------------------------------------------------------------------------------------------------------------------- Physical Exam BP 114/70 (BP Location: Left Arm, Patient Position: Sitting, Cuff Size: Normal)   Pulse 87   Ht 6\' 2"  (1.88 m)   Wt 154 lb (69.9 kg)   SpO2 98%   BMI 19.77 kg/m   Physical Exam Constitutional:      Appearance: Normal appearance.  Eyes:     General: No scleral icterus. Cardiovascular:     Rate and Rhythm: Normal rate and regular rhythm.  Pulmonary:     Effort: Pulmonary effort is normal.     Breath sounds: Normal breath sounds.  Musculoskeletal:     Cervical back: Neck supple.  Neurological:     Mental Status: He is alert.  Psychiatric:        Mood and Affect: Mood normal.        Behavior: Behavior normal.     ------------------------------------------------------------------------------------------------------------------------------------------------------------------------------------------------------------------- Assessment and Plan  Recurrent UTI He will complete course of augmentin.  Contact clinic if symptoms persist.   Epigastric pain Continue protonix for now.  Follow up  in 1 month.  Discussed red flags and precautions.  Contact clinic if worsening.    HTN (hypertension) BP is well controlled with current medication.  He will continue lisinopril/hctz at current strength.   Anxiety Adding lexapro 5mg  x1 week then increase to 10mg .     Meds ordered this encounter  Medications   escitalopram (LEXAPRO) 10 MG tablet    Sig: Take 5mg  daily x7 days then increase to 10mg  daily.    Dispense:  30 tablet    Refill:  3    Return in about 4 weeks (around 06/24/2023) for epigastric pain.    This visit occurred during the SARS-CoV-2 public health emergency.  Safety protocols were in place, including screening questions prior to the visit, additional usage of staff PPE, and extensive cleaning of exam room while observing appropriate contact time as indicated for disinfecting solutions.

## 2023-05-27 NOTE — Assessment & Plan Note (Signed)
Adding lexapro 5mg  x1 week then increase to 10mg .

## 2023-05-27 NOTE — Assessment & Plan Note (Signed)
He will complete course of augmentin.  Contact clinic if symptoms persist.

## 2023-06-10 ENCOUNTER — Other Ambulatory Visit: Payer: Self-pay | Admitting: Family Medicine

## 2023-06-10 DIAGNOSIS — I10 Essential (primary) hypertension: Secondary | ICD-10-CM

## 2023-06-24 ENCOUNTER — Ambulatory Visit (INDEPENDENT_AMBULATORY_CARE_PROVIDER_SITE_OTHER): Payer: Medicare Other | Admitting: Family Medicine

## 2023-06-24 ENCOUNTER — Encounter: Payer: Self-pay | Admitting: Family Medicine

## 2023-06-24 VITALS — BP 102/67 | HR 71 | Ht 74.0 in | Wt 154.0 lb

## 2023-06-24 DIAGNOSIS — N390 Urinary tract infection, site not specified: Secondary | ICD-10-CM

## 2023-06-24 DIAGNOSIS — R1013 Epigastric pain: Secondary | ICD-10-CM | POA: Diagnosis not present

## 2023-06-24 LAB — POCT URINALYSIS DIP (CLINITEK)
Bilirubin, UA: NEGATIVE
Glucose, UA: NEGATIVE mg/dL
Ketones, POC UA: NEGATIVE mg/dL
Nitrite, UA: POSITIVE — AB
POC PROTEIN,UA: NEGATIVE
Spec Grav, UA: 1.02 (ref 1.010–1.025)
Urobilinogen, UA: 0.2 U/dL
pH, UA: 6 (ref 5.0–8.0)

## 2023-06-24 MED ORDER — AMOXICILLIN-POT CLAVULANATE 875-125 MG PO TABS: 1.0000 | ORAL_TABLET | Freq: Two times a day (BID) | ORAL | 0 refills | Status: AC

## 2023-06-24 NOTE — Progress Notes (Signed)
Adiel B Alvis Sr. - 67 y.o. male MRN [id number]  Date of birth: 1956/05/14  Subjective Chief Complaint  Patient presents with   Urinary Tract Infection    HPI Treshaun B Rings Sr. Is a 67 y.o. male here today for follow up visit.   He was seen previously for epigastric pain.  Had only taken protonix for a few days at that time.  He has taken this more consistently at this point.  Reports that this has improved some.  Denies nausea.   Still having some urinary frequency.  Denies pain with urination.  UTI treated with macrobid initially then changed to augmentin last month due to growing ESBL.  He is not sure if he took the augmentin.  He is also taking flomax.   ROS:  A comprehensive ROS was completed and negative except as noted per HPI  No Known Allergies  Past Medical History:  Diagnosis Date   HLD (hyperlipidemia) 06/15/2018   Hypertension     Past Surgical History:  Procedure Laterality Date   Arm surgery     arthroscopic knee surgery      Social History   Socioeconomic History   Marital status: Married    Spouse name: Tonia Ghent Stenner   Number of children: 3   Years of education: 16   Highest education level: Bachelor's degree (e.g., BA, AB, BS)  Occupational History    Comment: Retired  Tobacco Use   Smoking status: Some Days    Types: Cigars   Smokeless tobacco: Current   Tobacco comments:    Occasional use of cigars  Vaping Use   Vaping status: Never Used  Substance and Sexual Activity   Alcohol use: Yes    Alcohol/week: 1.0 standard drink of alcohol    Types: 1 Cans of beer per week    Comment: Occasional   Drug use: No   Sexual activity: Not Currently  Other Topics Concern   Not on file  Social History Narrative   Lives with his wife. Enjoys fishing, playing golf.   Social Determinants of Health   Financial Resource Strain: Low Risk  (05/26/2023)   Overall Financial Resource Strain (CARDIA)    Difficulty of Paying Living Expenses: Not hard  at all  Food Insecurity: No Food Insecurity (05/26/2023)   Hunger Vital Sign    Worried About Running Out of Food in the Last Year: Never true    Ran Out of Food in the Last Year: Never true  Transportation Needs: No Transportation Needs (05/26/2023)   PRAPARE - Administrator, Civil Service (Medical): No    Lack of Transportation (Non-Medical): No  Physical Activity: Sufficiently Active (05/26/2023)   Exercise Vital Sign    Days of Exercise per Week: 6 days    Minutes of Exercise per Session: 30 min  Stress: No Stress Concern Present (05/26/2023)   [employer] of Occupational Health - Occupational Stress Questionnaire    Feeling of Stress : Only a little  Social Connections: Unknown (05/26/2023)   Social Connection and Isolation Panel [NHANES]    Frequency of Communication with Friends and Family: More than three times a week    Frequency of Social Gatherings with Friends and Family: Three times a week    Attends Religious Services: Patient declined    Active Member of Clubs or Organizations: Yes    Attends Banker Meetings: Patient declined    Marital Status: Married  Recent Concern: Social Connections - Moderately Isolated (04/27/2023)  Social Connection and Isolation Panel [NHANES]    Frequency of Communication with Friends and Family: More than three times a week    Frequency of Social Gatherings with Friends and Family: More than three times a week    Attends Religious Services: Never    Database administrator or Organizations: No    Attends Engineer, structural: Never    Marital Status: Married    Family History  Problem Relation Age of Onset   Diabetes Mother    Hypertension Mother    Hypertension Father     Health Maintenance  Topic Date Due   COVID-19 Vaccine (5 - 2023-24 season) 04/12/2023   Pneumonia Vaccine 56+ Years old (1 of 2 - PCV) 04/26/2024 (Originally 03/15/1962)   Medicare Annual Wellness (AWV)  04/26/2024   Fecal  DNA (Cologuard)  12/11/2024   DTaP/Tdap/Td (2 - Td or Tdap) 04/21/2026   INFLUENZA VACCINE  Completed   Hepatitis C Screening  Completed   HIV Screening  Completed   Zoster Vaccines- Shingrix  Completed   HPV VACCINES  Aged Out     ----------------------------------------------------------------------------------------------------------------------------------------------------------------------------------------------------------------- Physical Exam BP 102/67 (BP Location: Left Arm, Patient Position: Sitting, Cuff Size: Normal)   Pulse 71   Ht 6\' 2"  (1.88 m)   Wt 154 lb (69.9 kg)   SpO2 100%   BMI 19.77 kg/m   Physical Exam Constitutional:      Appearance: Normal appearance.  Eyes:     General: No scleral icterus. Cardiovascular:     Rate and Rhythm: Normal rate and regular rhythm.  Pulmonary:     Effort: Pulmonary effort is normal.     Breath sounds: Normal breath sounds.  Neurological:     Mental Status: He is alert.  Psychiatric:        Mood and Affect: Mood normal.        Behavior: Behavior normal.     ------------------------------------------------------------------------------------------------------------------------------------------------------------------------------------------------------------------- Assessment and Plan  Recurrent UTI Last culture with ESBL sensitive to Augmentin.  UA remains consistent with UTI.  Will add augmentin back on.  Urine sent for culture.  I explained to him that if symptoms are not resolving or if culture shows additional resistance he may need hospital admission for antibiotics.    Epigastric pain Improved with protonix.  Will plan to continue.  If not resolving recommend GI referral.    Meds ordered this encounter  Medications   amoxicillin-clavulanate (AUGMENTIN) 875-125 MG tablet    Sig: Take 1 tablet by mouth 2 (two) times daily for 14 days.    Dispense:  28 tablet    Refill:  0    No follow-ups on  file.    This visit occurred during the SARS-CoV-2 public health emergency.  Safety protocols were in place, including screening questions prior to the visit, additional usage of staff PPE, and extensive cleaning of exam room while observing appropriate contact time as indicated for disinfecting solutions.

## 2023-06-24 NOTE — Patient Instructions (Signed)
Start augmentin for UTI.  If culture shows resistance to this you may need to be seen in the hospital for IV antibiotics.   Continue pantoprazole..  Let me know if stomach pain doesn't fully resolve over the next month.

## 2023-06-24 NOTE — Assessment & Plan Note (Signed)
Last culture with ESBL sensitive to Augmentin.  UA remains consistent with UTI.  Will add augmentin back on.  Urine sent for culture.  I explained to him that if symptoms are not resolving or if culture shows additional resistance he may need hospital admission for antibiotics.

## 2023-06-24 NOTE — Assessment & Plan Note (Signed)
Improved with protonix.  Will plan to continue.  If not resolving recommend GI referral.

## 2023-06-25 ENCOUNTER — Telehealth: Payer: Self-pay | Admitting: Family Medicine

## 2023-06-25 NOTE — Telephone Encounter (Signed)
Provided explanation concerning Augmentin via voicemail.   Pt is to restart taking Augmentin due to culture sensitivity.

## 2023-06-25 NOTE — Telephone Encounter (Signed)
Patient's wife called about prescription Augmentin 875-125mg  she is asking if that is the right medication or was it something different she needs clarification please    Phone [phone] wife             [phone] [place]

## 2023-06-27 LAB — URINE CULTURE

## 2023-10-13 ENCOUNTER — Other Ambulatory Visit: Payer: Self-pay | Admitting: Family Medicine

## 2023-10-13 DIAGNOSIS — I10 Essential (primary) hypertension: Secondary | ICD-10-CM

## 2023-10-13 NOTE — Telephone Encounter (Signed)
 Copied from CRM [id number]. Topic: Clinical - Medication Refill >> Oct 13, 2023 10:44 AM Nila Nephew wrote: Most Recent Primary Care Visit:  Provider: Everrett Coombe  Department: [facility]  Visit Type: OFFICE VISIT  Date: 06/24/2023  Medication: lisinopril-hydrochlorothiazide (ZESTORETIC) 20-12.5 MG tablet  Has the patient contacted their pharmacy? Yes  Is this the correct pharmacy for this prescription? Yes  This is the patient's preferred pharmacy:  [id number] [place] Otto, [place] - [address] AT [facility] OF [facility] & [address] [place] [address] Phone: [phone] Fax: [phone]   Has the prescription been filled recently? No  Is the patient out of the medication? Yes  Has the patient been seen for an appointment in the last year OR does the patient have an upcoming appointment? Yes  Can we respond through MyChart? No  Agent: Please be advised that Rx refills may take up to 3 business days. We ask that you follow-up with your pharmacy.

## 2023-10-13 NOTE — Telephone Encounter (Signed)
 Last Fill: 06/10/23  Last OV: 06/24/23 Next OV: 04/27/24 AWV  Routing to provider for review/authorization.

## 2023-10-14 ENCOUNTER — Other Ambulatory Visit: Payer: Self-pay

## 2023-10-14 DIAGNOSIS — I10 Essential (primary) hypertension: Secondary | ICD-10-CM

## 2023-10-14 MED ORDER — LISINOPRIL-HYDROCHLOROTHIAZIDE 20-12.5 MG PO TABS: 1.0000 | ORAL_TABLET | Freq: Every day | ORAL | 0 refills | Status: DC

## 2023-10-16 ENCOUNTER — Other Ambulatory Visit: Payer: Self-pay

## 2023-10-16 DIAGNOSIS — I10 Essential (primary) hypertension: Secondary | ICD-10-CM

## 2023-10-16 MED ORDER — LISINOPRIL-HYDROCHLOROTHIAZIDE 20-12.5 MG PO TABS: 1.0000 | ORAL_TABLET | Freq: Every day | ORAL | 0 refills | Status: DC

## 2023-11-03 ENCOUNTER — Ambulatory Visit (INDEPENDENT_AMBULATORY_CARE_PROVIDER_SITE_OTHER): Admitting: Family Medicine

## 2023-11-03 ENCOUNTER — Encounter: Payer: Self-pay | Admitting: Family Medicine

## 2023-11-03 VITALS — BP 107/71 | HR 82 | Ht 74.0 in | Wt 169.0 lb

## 2023-11-03 DIAGNOSIS — N390 Urinary tract infection, site not specified: Secondary | ICD-10-CM

## 2023-11-03 DIAGNOSIS — I10 Essential (primary) hypertension: Secondary | ICD-10-CM

## 2023-11-03 DIAGNOSIS — E782 Mixed hyperlipidemia: Secondary | ICD-10-CM

## 2023-11-03 DIAGNOSIS — F419 Anxiety disorder, unspecified: Secondary | ICD-10-CM

## 2023-11-03 LAB — POCT URINALYSIS DIP (CLINITEK)
Bilirubin, UA: NEGATIVE
Glucose, UA: NEGATIVE mg/dL
Ketones, POC UA: NEGATIVE mg/dL
Nitrite, UA: POSITIVE — AB
POC PROTEIN,UA: 30 — AB
Spec Grav, UA: >=1.03 — AB (ref 1.010–1.025)
Urobilinogen, UA: 0.2 U/dL
pH, UA: 6 (ref 5.0–8.0)

## 2023-11-03 MED ORDER — AMOXICILLIN-POT CLAVULANATE 875-125 MG PO TABS: 1.0000 | ORAL_TABLET | Freq: Two times a day (BID) | ORAL | 0 refills | Status: AC

## 2023-11-03 MED ORDER — HYDROXYZINE HCL 25 MG PO TABS: 25.0000 mg | ORAL_TABLET | Freq: Three times a day (TID) | ORAL | 3 refills | Status: AC | PRN

## 2023-11-03 MED ORDER — ESCITALOPRAM OXALATE 20 MG PO TABS: ORAL_TABLET | ORAL | 1 refills | Status: DC

## 2023-11-03 NOTE — Assessment & Plan Note (Signed)
BP is well controlled with current medication.  He will continue lisinopril/hctz at current strength.

## 2023-11-03 NOTE — Assessment & Plan Note (Signed)
 He has symptoms as well as urinalysis consistent with UTI.  Previous cultures with ESBL.  This was sensitive to Augmentin and I will go ahead and send this over for treatment.  Urine sent for culture.  If symptoms persist he may need admission for IV antibiotics.

## 2023-11-03 NOTE — Assessment & Plan Note (Signed)
Continue atorvastatin.   Update lipid panel.  

## 2023-11-03 NOTE — Patient Instructions (Signed)
 Increase lexapro to 20mg  daily for anxiety.  Use hydroxyzine every 8 hours AS NEEDED for anxiety or sleep.  Start augmentin for UTI. We'll be in touch with culture and lab results.

## 2023-11-03 NOTE — Progress Notes (Signed)
 Jordan B Peary Sr. - 68 y.o. male MRN [id number]  Date of birth: 1956-07-17  Subjective Chief Complaint  Patient presents with   Urinary Tract Infection    HPI Jordan B Trotter Sr. is a 68 year old male here today for follow-up visit.  He has history of recurrent UTI.  He is having increased symptoms including frequency and urgency.  Urine looks cloudy.  Previous culture with ESBL.  This was sensitive to Augmentin in the past.  He would like to try to avoid hospitalization if possible.  He denies fever, chills or flank pain.  Blood pressure mains well-controlled with lisinopril/hydrochlorothiazide at current strength.  He denies side effects.  He has not had chest pain, shortness of breath, palpitations, headaches or vision change.  Tolerating atorvastatin well at current strength.  He is having increased anxiety.  Cites a few stressors that are contributing.  Having some trouble with sleep as well.  ROS:  A comprehensive ROS was completed and negative except as noted per HPI   No Known Allergies  Past Medical History:  Diagnosis Date   HLD (hyperlipidemia) 06/15/2018   Hypertension     Past Surgical History:  Procedure Laterality Date   Arm surgery     arthroscopic knee surgery      Social History   Socioeconomic History   Marital status: Married    Spouse name: Jordan Coffey   Number of children: 3   Years of education: 16   Highest education level: Bachelor's degree (e.g., BA, AB, BS)  Occupational History    Comment: Retired  Tobacco Use   Smoking status: Some Days    Types: Cigars   Smokeless tobacco: Current   Tobacco comments:    Occasional use of cigars  Vaping Use   Vaping status: Never Used  Substance and Sexual Activity   Alcohol use: Yes    Alcohol/week: 1.0 standard drink of alcohol    Types: 1 Cans of beer per week    Comment: Occasional   Drug use: No   Sexual activity: Not Currently  Other Topics Concern   Not on file  Social History  Narrative   Lives with his wife. Enjoys fishing, playing golf.   Social Drivers of Corporate investment banker Strain: Low Risk  (10/30/2023)   Overall Financial Resource Strain (CARDIA)    Difficulty of Paying Living Expenses: Not very hard  Food Insecurity: No Food Insecurity (10/30/2023)   Hunger Vital Sign    Worried About Running Out of Food in the Last Year: Never true    Ran Out of Food in the Last Year: Never true  Transportation Needs: No Transportation Needs (10/30/2023)   PRAPARE - Administrator, Civil Service (Medical): No    Lack of Transportation (Non-Medical): No  Physical Activity: Insufficiently Active (10/30/2023)   Exercise Vital Sign    Days of Exercise per Week: 4 days    Minutes of Exercise per Session: 20 min  Stress: Stress Concern Present (10/30/2023)   [employer] of Occupational Health - Occupational Stress Questionnaire    Feeling of Stress : To some extent  Social Connections: Unknown (10/30/2023)   Social Connection and Isolation Panel [NHANES]    Frequency of Communication with Friends and Family: More than three times a week    Frequency of Social Gatherings with Friends and Family: Patient declined    Attends Religious Services: Patient declined    Database administrator or Organizations: Patient declined  Attends Banker Meetings: Patient declined    Marital Status: Married    Family History  Problem Relation Age of Onset   Diabetes Mother    Hypertension Mother    Hypertension Father     Health Maintenance  Topic Date Due   COVID-19 Vaccine (5 - 2024-25 season) 04/20/2024 (Originally 04/12/2023)   Pneumonia Vaccine 39+ Years old (1 of 2 - PCV) 04/26/2024 (Originally [birth year]/15/1962)   Medicare Annual Wellness (AWV)  04/26/2024   Fecal DNA (Cologuard)  12/11/2024   DTaP/Tdap/Td (2 - Td or Tdap) 04/21/2026   INFLUENZA VACCINE  Completed   Hepatitis C Screening  Completed   HIV Screening  Completed   Zoster Vaccines-  Shingrix  Completed   HPV VACCINES  Aged Out     ----------------------------------------------------------------------------------------------------------------------------------------------------------------------------------------------------------------- Physical Exam BP 107/71 (BP Location: Left Arm, Patient Position: Sitting, Cuff Size: Normal)   Pulse 82   Ht 6\' 2"  (1.88 m)   Wt 169 lb (76.7 kg)   SpO2 97%   BMI 21.70 kg/m   Physical Exam Constitutional:      Appearance: Normal appearance.  HENT:     Head: Normocephalic and atraumatic.  Eyes:     General: No scleral icterus. Cardiovascular:     Rate and Rhythm: Normal rate and regular rhythm.  Pulmonary:     Effort: Pulmonary effort is normal.     Breath sounds: Normal breath sounds.  Musculoskeletal:     Cervical back: Neck supple.  Neurological:     Mental Status: He is alert.  Psychiatric:        Mood and Affect: Mood normal.        Behavior: Behavior normal.     ------------------------------------------------------------------------------------------------------------------------------------------------------------------------------------------------------------------- Assessment and Plan  Anxiety Increasing Lexapro to 20 mg daily.  Adding hydroxyzine as needed.  Recurrent UTI He has symptoms as well as urinalysis consistent with UTI.  Previous cultures with ESBL.  This was sensitive to Augmentin and I will go ahead and send this over for treatment.  Urine sent for culture.  If symptoms persist he may need admission for IV antibiotics.  HTN (hypertension) BP is well controlled with current medication.  He will continue lisinopril/hctz at current strength.   HLD (hyperlipidemia) Continue atorvastatin.  Update lipid panel.    Meds ordered this encounter  Medications   escitalopram (LEXAPRO) 20 MG tablet    Sig: Take 20mg  daily for anxiety.    Dispense:  90 tablet    Refill:  1   hydrOXYzine  (ATARAX) 25 MG tablet    Sig: Take 1 tablet (25 mg total) by mouth every 8 (eight) hours as needed for anxiety.    Dispense:  120 tablet    Refill:  3   amoxicillin-clavulanate (AUGMENTIN) 875-125 MG tablet    Sig: Take 1 tablet by mouth 2 (two) times daily for 14 days.    Dispense:  28 tablet    Refill:  0    Return in about 2 months (around 01/03/2024) for Annual Exam, Mood/BH.    This visit occurred during the SARS-CoV-2 public health emergency.  Safety protocols were in place, including screening questions prior to the visit, additional usage of staff PPE, and extensive cleaning of exam room while observing appropriate contact time as indicated for disinfecting solutions.

## 2023-11-03 NOTE — Assessment & Plan Note (Signed)
 Increasing Lexapro to 20 mg daily.  Adding hydroxyzine as needed.

## 2023-11-04 ENCOUNTER — Telehealth: Payer: Self-pay | Admitting: Family Medicine

## 2023-11-04 ENCOUNTER — Other Ambulatory Visit (HOSPITAL_COMMUNITY): Payer: Self-pay

## 2023-11-04 ENCOUNTER — Telehealth: Payer: Self-pay

## 2023-11-04 LAB — LIPID PANEL WITH LDL/HDL RATIO
Cholesterol, Total: 102 mg/dL (ref 100–199)
HDL: 49 mg/dL (ref >39)
LDL Chol Calc (NIH): 37 mg/dL (ref 0–99)
LDL/HDL Ratio: 0.8 ratio (ref 0.0–3.6)
Triglycerides: 81 mg/dL (ref 0–149)
VLDL Cholesterol Cal: 16 mg/dL (ref 5–40)

## 2023-11-04 LAB — CMP14+EGFR
ALT: 16 IU/L (ref 0–44)
AST: 21 IU/L (ref 0–40)
Albumin: 4.2 g/dL (ref 3.9–4.9)
Alkaline Phosphatase: 89 IU/L (ref 44–121)
BUN/Creatinine Ratio: 15 (ref 10–24)
BUN: 13 mg/dL (ref 8–27)
Bilirubin Total: 0.3 mg/dL (ref 0.0–1.2)
CO2: 22 mmol/L (ref 20–29)
Calcium: 9.7 mg/dL (ref 8.6–10.2)
Chloride: 102 mmol/L (ref 96–106)
Creatinine, Ser: 0.89 mg/dL (ref 0.76–1.27)
Globulin, Total: 3.1 g/dL (ref 1.5–4.5)
Glucose: 121 mg/dL — ABNORMAL HIGH (ref 70–99)
Potassium: 4.1 mmol/L (ref 3.5–5.2)
Sodium: 140 mmol/L (ref 134–144)
Total Protein: 7.3 g/dL (ref 6.0–8.5)
eGFR: 95 mL/min/{1.73_m2} (ref >59)

## 2023-11-04 LAB — CBC WITH DIFFERENTIAL/PLATELET
Basophils Absolute: 0 10*3/uL (ref 0.0–0.2)
Basos: 1 %
EOS (ABSOLUTE): 0.5 10*3/uL — ABNORMAL HIGH (ref 0.0–0.4)
Eos: 6 %
Hematocrit: 40.7 % (ref 37.5–51.0)
Hemoglobin: 13.4 g/dL (ref 13.0–17.7)
Immature Grans (Abs): 0 10*3/uL (ref 0.0–0.1)
Immature Granulocytes: 0 %
Lymphocytes Absolute: 2.2 10*3/uL (ref 0.7–3.1)
Lymphs: 28 %
MCH: 30 pg (ref 26.6–33.0)
MCHC: 32.9 g/dL (ref 31.5–35.7)
MCV: 91 fL (ref 79–97)
Monocytes Absolute: 0.4 10*3/uL (ref 0.1–0.9)
Monocytes: 5 %
Neutrophils Absolute: 4.8 10*3/uL (ref 1.4–7.0)
Neutrophils: 60 %
Platelets: 281 10*3/uL (ref 150–450)
RBC: 4.46 x10E6/uL (ref 4.14–5.80)
RDW: 13.2 % (ref 11.6–15.4)
WBC: 7.9 10*3/uL (ref 3.4–10.8)

## 2023-11-04 LAB — PSA: Prostate Specific Ag, Serum: 2.3 ng/mL (ref 0.0–4.0)

## 2023-11-04 NOTE — Telephone Encounter (Signed)
 Copied from CRM [id number]. Topic: General - Other >> Nov 04, 2023  4:36 PM Emylou G wrote: Reason for CRM: Marylene Land w/[employer] called adv hydrOXYzine HCl 25MG  tablets has been approved - 11/04/23 through 11/03/24 can be called back if need to [id number] option 5

## 2023-11-04 NOTE — Telephone Encounter (Signed)
 Pharmacy Patient Advocate Encounter   Received notification from Onbase that prior authorization for hydrOXYzine HCl 25MG  tablets is required/requested.   Insurance verification completed.   The patient is insured through [facility] .   Per test claim: PA required; PA submitted to above mentioned insurance via CoverMyMeds Key/confirmation #/EOC [id number] Status is pending

## 2023-11-05 ENCOUNTER — Other Ambulatory Visit (HOSPITAL_COMMUNITY): Payer: Self-pay

## 2023-11-05 ENCOUNTER — Telehealth: Payer: Self-pay

## 2023-11-05 NOTE — Telephone Encounter (Signed)
 Task completed. A MyChart msg was sent to the patient regarding the outcome of the auth.

## 2023-11-05 NOTE — Telephone Encounter (Signed)
 Pharmacy Patient Advocate Encounter  Received notification from [facility] that Prior Authorization for Hydroxyzine 25 has been approved.   Pt copay through test billing is $10.00

## 2023-11-06 LAB — URINE CULTURE

## 2023-11-09 ENCOUNTER — Other Ambulatory Visit (HOSPITAL_COMMUNITY): Payer: Self-pay

## 2023-11-12 ENCOUNTER — Encounter: Payer: Self-pay | Admitting: Family Medicine

## 2023-11-13 ENCOUNTER — Other Ambulatory Visit (HOSPITAL_COMMUNITY): Payer: Self-pay

## 2023-11-13 NOTE — Telephone Encounter (Signed)
 Pharmacy Patient Advocate Encounter  Received notification from [facility] [employer] that Prior Authorization for hydrOXYzine HCl 25MG  tablets has been APPROVED from 11/04/23 to 11/03/24. Unable to obtain price due to refill too soon rejection, last fill date 11/06/23 next available fill date04/20/25   PA #/Case ID/Reference #: [id number]

## 2023-11-19 ENCOUNTER — Other Ambulatory Visit: Payer: Self-pay

## 2023-11-19 MED ORDER — ATORVASTATIN CALCIUM 40 MG PO TABS: 40.0000 mg | ORAL_TABLET | Freq: Every day | ORAL | 1 refills | Status: DC

## 2024-01-06 ENCOUNTER — Ambulatory Visit (INDEPENDENT_AMBULATORY_CARE_PROVIDER_SITE_OTHER): Admitting: Family Medicine

## 2024-01-06 ENCOUNTER — Encounter: Payer: Self-pay | Admitting: Family Medicine

## 2024-01-06 VITALS — BP 105/66 | HR 93 | Ht 74.0 in | Wt 166.0 lb

## 2024-01-06 DIAGNOSIS — N401 Enlarged prostate with lower urinary tract symptoms: Secondary | ICD-10-CM | POA: Diagnosis not present

## 2024-01-06 DIAGNOSIS — N3281 Overactive bladder: Secondary | ICD-10-CM | POA: Diagnosis not present

## 2024-01-06 DIAGNOSIS — F419 Anxiety disorder, unspecified: Secondary | ICD-10-CM

## 2024-01-06 DIAGNOSIS — R350 Frequency of micturition: Secondary | ICD-10-CM | POA: Diagnosis not present

## 2024-01-06 DIAGNOSIS — I10 Essential (primary) hypertension: Secondary | ICD-10-CM | POA: Diagnosis not present

## 2024-01-06 NOTE — Assessment & Plan Note (Signed)
BP is well controlled with current medication.  He will continue lisinopril/hctz at current strength.

## 2024-01-06 NOTE — Assessment & Plan Note (Signed)
 We discussed taking Lexapro  on a regular basis, preferably at night as I think this will be more effective for his anxiety.

## 2024-01-06 NOTE — Assessment & Plan Note (Signed)
 Continues to have urinary urgency and frequency.  History of ESBL but denies dysuria at this time.   New referral to urology placed.

## 2024-01-06 NOTE — Patient Instructions (Signed)
-  Take Escitalopram  every night.  It may take a few weeks to see effects from this.   -Use hydroxyzine  as needed for anxiety episodes or sleep.

## 2024-01-06 NOTE — Progress Notes (Signed)
 Jordan B Forton Sr. - 68 y.o. male MRN [id number]  Date of birth: [birth year]  Subjective Chief Complaint  Patient presents with   Mood    HPI Jordan B Dicamillo Sr. is a 68 year old male here today for follow-up visit.  Continues to deal with some anxiety.  He has not been taking Lexapro  regular basis he is only taking as needed.  He does have hydroxyzine  as well.  Does not use this very often due to this making him feel sleepy.  Blood pressure is well-controlled with lisinopril /hydrochlorothiazide .  Denies side effects at current strength.  He has not had chest pain, shortness of breath, palpitations, headaches or vision change.  Continues to have some urinary frequency.  Would like referral to a new urologist.  ROS:  A comprehensive ROS was completed and negative except as noted per HPI   No Known Allergies  Past Medical History:  Diagnosis Date   HLD (hyperlipidemia) 06/15/2018   Hypertension     Past Surgical History:  Procedure Laterality Date   Arm surgery     arthroscopic knee surgery      Social History   Socioeconomic History   Marital status: Married    Spouse name: Jordan Coffey   Number of children: 3   Years of education: 16   Highest education level: Bachelor's degree (e.g., BA, AB, BS)  Occupational History    Comment: Retired  Tobacco Use   Smoking status: Some Days    Types: Cigars   Smokeless tobacco: Current   Tobacco comments:    Occasional use of cigars  Vaping Use   Vaping status: Never Used  Substance and Sexual Activity   Alcohol use: Yes    Alcohol/week: 1.0 standard drink of alcohol    Types: 1 Cans of beer per week    Comment: Occasional   Drug use: No   Sexual activity: Not Currently  Other Topics Concern   Not on file  Social History Narrative   Lives with his wife. Enjoys fishing, playing golf.   Social Drivers of Corporate investment banker Strain: Low Risk  (10/30/2023)   Overall Financial Resource Strain (CARDIA)     Difficulty of Paying Living Expenses: Not very hard  Food Insecurity: No Food Insecurity (10/30/2023)   Hunger Vital Sign    Worried About Running Out of Food in the Last Year: Never true    Ran Out of Food in the Last Year: Never true  Transportation Needs: No Transportation Needs (10/30/2023)   PRAPARE - Administrator, Civil Service (Medical): No    Lack of Transportation (Non-Medical): No  Physical Activity: Insufficiently Active (10/30/2023)   Exercise Vital Sign    Days of Exercise per Week: 4 days    Minutes of Exercise per Session: 20 min  Stress: Stress Concern Present (10/30/2023)   [employer] of Occupational Health - Occupational Stress Questionnaire    Feeling of Stress : To some extent  Social Connections: Unknown (10/30/2023)   Social Connection and Isolation Panel [NHANES]    Frequency of Communication with Friends and Family: More than three times a week    Frequency of Social Gatherings with Friends and Family: Patient declined    Attends Religious Services: Patient declined    Database administrator or Organizations: Patient declined    Attends Banker Meetings: Patient declined    Marital Status: Married    Family History  Problem Relation Age of Onset   Diabetes  Mother    Hypertension Mother    Hypertension Father     Health Maintenance  Topic Date Due   COVID-19 Vaccine (5 - 2024-25 season) 04/20/2024 (Originally 04/12/2023)   Pneumonia Vaccine 57+ Years old (1 of 2 - PCV) 04/26/2024 (Originally 03/15/1977)   INFLUENZA VACCINE  03/11/2024   Medicare Annual Wellness (AWV)  04/26/2024   Fecal DNA (Cologuard)  12/11/2024   DTaP/Tdap/Td (2 - Td or Tdap) 04/21/2026   Hepatitis C Screening  Completed   HIV Screening  Completed   Zoster Vaccines- Shingrix  Completed   HPV VACCINES  Aged Out   Meningococcal B Vaccine  Aged Out      ----------------------------------------------------------------------------------------------------------------------------------------------------------------------------------------------------------------- Physical Exam BP 105/66 (BP Location: Left Arm, Patient Position: Sitting, Cuff Size: Normal)   Pulse 93   Ht 6\' 2"  (1.88 m)   Wt 166 lb (75.3 kg)   SpO2 100%   BMI 21.31 kg/m   Physical Exam Constitutional:      Appearance: Normal appearance.  HENT:     Head: Normocephalic and atraumatic.  Eyes:     General: No scleral icterus. Cardiovascular:     Rate and Rhythm: Normal rate and regular rhythm.  Pulmonary:     Effort: Pulmonary effort is normal.     Breath sounds: Normal breath sounds.  Musculoskeletal:     Cervical back: Neck supple.  Neurological:     Mental Status: He is alert.  Psychiatric:        Mood and Affect: Mood normal.        Behavior: Behavior normal.     ------------------------------------------------------------------------------------------------------------------------------------------------------------------------------------------------------------------- Assessment and Plan  HTN (hypertension) BP is well controlled with current medication.  He will continue lisinopril /hctz at current strength.   BPH (benign prostatic hyperplasia) Continues to have urinary urgency and frequency.  History of ESBL but denies dysuria at this time.   New referral to urology placed.  Anxiety We discussed taking Lexapro  on a regular basis, preferably at night as I think this will be more effective for his anxiety.   No orders of the defined types were placed in this encounter.   Return in about 4 months (around 05/08/2024) for Mood/BH.

## 2024-02-10 ENCOUNTER — Other Ambulatory Visit: Payer: Self-pay | Admitting: Family Medicine

## 2024-02-10 DIAGNOSIS — I10 Essential (primary) hypertension: Secondary | ICD-10-CM

## 2024-02-10 NOTE — Telephone Encounter (Signed)
 Copied from CRM [id number]. Topic: Clinical - Medication Refill >> Feb 10, 2024  4:11 PM Miquel SAILOR wrote: Medication: lisinopril -hydrochlorothiazide  (ZESTORETIC ) 20-12.5 MG tablet   Has the patient contacted their pharmacy? Yes (Agent: If no, request that the patient contact the pharmacy for the refill. If patient does not wish to contact the pharmacy document the reason why and proceed with request.) (Agent: If yes, when and what did the pharmacy advise?)  This is the patient's preferred pharmacy:  [id number] [place],  - [address] AT [facility] OF [facility] & [address] [place] [address] [place] [address] Phone: [phone] Fax: [phone]  Is this the correct pharmacy for this prescription? Yes If no, delete pharmacy and type the correct one.   Has the prescription been filled recently? Yes  Is the patient out of the medication? No-4 pills left   Has the patient been seen for an appointment in the last year OR does the patient have an upcoming appointment? Yes  Can we respond through MyChart? Yes  Agent: Please be advised that Rx refills may take up to 3 business days. We ask that you follow-up with your pharmacy.

## 2024-02-11 ENCOUNTER — Other Ambulatory Visit: Payer: Self-pay

## 2024-02-11 DIAGNOSIS — I10 Essential (primary) hypertension: Secondary | ICD-10-CM

## 2024-02-11 MED ORDER — LISINOPRIL-HYDROCHLOROTHIAZIDE 20-12.5 MG PO TABS
1.0000 | ORAL_TABLET | Freq: Every day | ORAL | 0 refills | Status: DC
Start: 2024-02-11 — End: 2024-05-11

## 2024-02-27 ENCOUNTER — Other Ambulatory Visit: Payer: Self-pay | Admitting: Family Medicine

## 2024-03-16 DIAGNOSIS — N401 Enlarged prostate with lower urinary tract symptoms: Secondary | ICD-10-CM | POA: Diagnosis not present

## 2024-03-16 DIAGNOSIS — R350 Frequency of micturition: Secondary | ICD-10-CM | POA: Diagnosis not present

## 2024-03-16 DIAGNOSIS — R3914 Feeling of incomplete bladder emptying: Secondary | ICD-10-CM | POA: Diagnosis not present

## 2024-04-12 ENCOUNTER — Encounter: Payer: Self-pay | Admitting: Sports Medicine

## 2024-04-29 DIAGNOSIS — Z1211 Encounter for screening for malignant neoplasm of colon: Secondary | ICD-10-CM | POA: Diagnosis not present

## 2024-04-29 DIAGNOSIS — R6881 Early satiety: Secondary | ICD-10-CM | POA: Diagnosis not present

## 2024-04-29 DIAGNOSIS — R109 Unspecified abdominal pain: Secondary | ICD-10-CM | POA: Diagnosis not present

## 2024-04-29 DIAGNOSIS — K219 Gastro-esophageal reflux disease without esophagitis: Secondary | ICD-10-CM | POA: Diagnosis not present

## 2024-05-04 ENCOUNTER — Ambulatory Visit (INDEPENDENT_AMBULATORY_CARE_PROVIDER_SITE_OTHER): Admitting: Family Medicine

## 2024-05-04 ENCOUNTER — Encounter: Payer: Self-pay | Admitting: Family Medicine

## 2024-05-04 VITALS — BP 101/64 | HR 76 | Ht 74.0 in | Wt 168.0 lb

## 2024-05-04 DIAGNOSIS — I10 Essential (primary) hypertension: Secondary | ICD-10-CM

## 2024-05-04 DIAGNOSIS — R1013 Epigastric pain: Secondary | ICD-10-CM

## 2024-05-04 DIAGNOSIS — Z23 Encounter for immunization: Secondary | ICD-10-CM

## 2024-05-04 DIAGNOSIS — R5383 Other fatigue: Secondary | ICD-10-CM | POA: Diagnosis not present

## 2024-05-04 DIAGNOSIS — N390 Urinary tract infection, site not specified: Secondary | ICD-10-CM

## 2024-05-04 NOTE — Assessment & Plan Note (Signed)
 Continues to have some early satiety.  Has upcoming EGD.

## 2024-05-04 NOTE — Assessment & Plan Note (Signed)
 Blood pressure is a little soft today and he has had some increased fatigue.  His diet has changed due to early satiety.  Will have him reduce his lisinopril /hydrochlorothiazide  to one half tab daily.

## 2024-05-04 NOTE — Assessment & Plan Note (Signed)
 He is followed by urology at this time.  Finasteride  recently started for BPH contributing to urinary retention.

## 2024-05-04 NOTE — Assessment & Plan Note (Signed)
 Work up for lab orders.  Will see if reduction of blood pressure medication is helpful as well.

## 2024-05-04 NOTE — Progress Notes (Signed)
 Jordan B Melroy Sr. - 67 y.o. male MRN [id number]  Date of birth: [birth year]  Subjective Chief Complaint  Patient presents with   Fatigue   Flank Pain    HPI Jordan B Finlayson Sr. Is a 68 y.o. male here today for follow up visit.   He reports that he is doing okay. Reports that he has had more fatigue over the past several months.  Feels that he is sleeping pretty well.  Denies any increased stress or depressive symptoms.  He is seeing urology for management of recurrent UTI.  He has grown ESBL in previous cultures.  He does have some flank pain but denies other urinary symptoms at this time including dysuria, frequency or urgency.SABRA   He is seeing gastroenterology for early satiety.  Planning on having EGD next week.  ROS:  A comprehensive ROS was completed and negative except as noted per HPI   No Known Allergies  Past Medical History:  Diagnosis Date   HLD (hyperlipidemia) 06/15/2018   Hypertension     Past Surgical History:  Procedure Laterality Date   Arm surgery     arthroscopic knee surgery      Social History   Socioeconomic History   Marital status: Married    Spouse name: Jacqueline K Burleson   Number of children: 3   Years of education: 16   Highest education level: Bachelor's degree (e.g., BA, AB, BS)  Occupational History    Comment: Retired  Tobacco Use   Smoking status: Some Days    Types: Cigars   Smokeless tobacco: Current   Tobacco comments:    Occasional use of cigars  Vaping Use   Vaping status: Never Used  Substance and Sexual Activity   Alcohol use: Yes    Alcohol/week: 1.0 standard drink of alcohol    Types: 1 Cans of beer per week    Comment: Occasional   Drug use: No   Sexual activity: Not Currently  Other Topics Concern   Not on file  Social History Narrative   Lives with his wife. Enjoys fishing, playing golf.   Social Drivers of Corporate investment banker Strain: Low Risk  (05/03/2024)   Overall Financial Resource Strain  (CARDIA)    Difficulty of Paying Living Expenses: Not very hard  Food Insecurity: No Food Insecurity (05/03/2024)   Hunger Vital Sign    Worried About Running Out of Food in the Last Year: Never true    Ran Out of Food in the Last Year: Never true  Transportation Needs: No Transportation Needs (05/03/2024)   PRAPARE - Administrator, Civil Service (Medical): No    Lack of Transportation (Non-Medical): No  Physical Activity: Unknown (05/03/2024)   Exercise Vital Sign    Days of Exercise per Week: Patient declined    Minutes of Exercise per Session: Not on file  Stress: Stress Concern Present (05/03/2024)   [employer] of Occupational Health - Occupational Stress Questionnaire    Feeling of Stress: To some extent  Social Connections: Unknown (05/03/2024)   Social Connection and Isolation Panel    Frequency of Communication with Friends and Family: More than three times a week    Frequency of Social Gatherings with Friends and Family: Not on file    Attends Religious Services: Not on file    Active Member of Clubs or Organizations: Patient declined    Attends Banker Meetings: Not on file    Marital Status: Married  Family History  Problem Relation Age of Onset   Diabetes Mother    Hypertension Mother    Hypertension Father     Health Maintenance  Topic Date Due   Medicare Annual Wellness (AWV)  04/26/2024   COVID-19 Vaccine (5 - 2025-26 season) 05/20/2024 (Originally 04/11/2024)   Fecal DNA (Cologuard)  12/11/2024   DTaP/Tdap/Td (2 - Td or Tdap) 04/21/2026   Pneumococcal Vaccine: 50+ Years  Completed   Influenza Vaccine  Completed   Hepatitis C Screening  Completed   Zoster Vaccines- Shingrix  Completed   HPV VACCINES  Aged Out   Meningococcal B Vaccine  Aged Out      ----------------------------------------------------------------------------------------------------------------------------------------------------------------------------------------------------------------- Physical Exam BP 101/64 (BP Location: Left Arm, Patient Position: Sitting, Cuff Size: Normal)   Pulse 76   Ht 6' 2 (1.88 m)   Wt 168 lb (76.2 kg)   SpO2 99%   BMI 21.57 kg/m   Physical Exam Constitutional:      Appearance: Normal appearance.  HENT:     Head: Normocephalic and atraumatic.  Cardiovascular:     Rate and Rhythm: Normal rate.  Pulmonary:     Effort: Pulmonary effort is normal.     Breath sounds: Normal breath sounds.  Neurological:     General: No focal deficit present.     Mental Status: He is alert.  Psychiatric:        Mood and Affect: Mood normal.        Behavior: Behavior normal.     ------------------------------------------------------------------------------------------------------------------------------------------------------------------------------------------------------------------- Assessment and Plan  HTN (hypertension) Blood pressure is a little soft today and he has had some increased fatigue.  His diet has changed due to early satiety.  Will have him reduce his lisinopril /hydrochlorothiazide  to one half tab daily.  Epigastric pain Continues to have some early satiety.  Has upcoming EGD.  Recurrent UTI He is followed by urology at this time.  Finasteride  recently started for BPH contributing to urinary retention.  Other fatigue Work up for lab orders.  Will see if reduction of blood pressure medication is helpful as well.   No orders of the defined types were placed in this encounter.   No follow-ups on file.

## 2024-05-04 NOTE — Patient Instructions (Addendum)
 Reduce lisinopril /hydrochlorothiazide  to 1/2 tablet daily.

## 2024-05-05 ENCOUNTER — Ambulatory Visit (INDEPENDENT_AMBULATORY_CARE_PROVIDER_SITE_OTHER)

## 2024-05-05 ENCOUNTER — Ambulatory Visit

## 2024-05-05 VITALS — Ht 75.0 in | Wt 168.0 lb

## 2024-05-05 DIAGNOSIS — Z0000 Encounter for general adult medical examination without abnormal findings: Secondary | ICD-10-CM | POA: Diagnosis not present

## 2024-05-05 LAB — CBC WITH DIFFERENTIAL/PLATELET
Basophils Absolute: 0.1 x10E3/uL (ref 0.0–0.2)
Basos: 1 %
EOS (ABSOLUTE): 0.2 x10E3/uL (ref 0.0–0.4)
Eos: 3 %
Hematocrit: 39.8 % (ref 37.5–51.0)
Hemoglobin: 13.3 g/dL (ref 13.0–17.7)
Immature Grans (Abs): 0 x10E3/uL (ref 0.0–0.1)
Immature Granulocytes: 0 %
Lymphocytes Absolute: 2.2 x10E3/uL (ref 0.7–3.1)
Lymphs: 30 %
MCH: 31.3 pg (ref 26.6–33.0)
MCHC: 33.4 g/dL (ref 31.5–35.7)
MCV: 94 fL (ref 79–97)
Monocytes Absolute: 0.5 x10E3/uL (ref 0.1–0.9)
Monocytes: 6 %
Neutrophils Absolute: 4.5 x10E3/uL (ref 1.4–7.0)
Neutrophils: 60 %
Platelets: 214 x10E3/uL (ref 150–450)
RBC: 4.25 x10E6/uL (ref 4.14–5.80)
RDW: 13.9 % (ref 11.6–15.4)
WBC: 7.4 x10E3/uL (ref 3.4–10.8)

## 2024-05-05 LAB — VITAMIN D 25 HYDROXY (VIT D DEFICIENCY, FRACTURES): Vit D, 25-Hydroxy: 27.3 ng/mL — ABNORMAL LOW (ref 30.0–100.0)

## 2024-05-05 LAB — CMP14+EGFR
ALT: 15 IU/L (ref 0–44)
AST: 19 IU/L (ref 0–40)
Albumin: 3.8 g/dL — ABNORMAL LOW (ref 3.9–4.9)
Alkaline Phosphatase: 85 IU/L (ref 47–123)
BUN/Creatinine Ratio: 13 (ref 10–24)
BUN: 12 mg/dL (ref 8–27)
Bilirubin Total: 0.3 mg/dL (ref 0.0–1.2)
CO2: 21 mmol/L (ref 20–29)
Calcium: 9.4 mg/dL (ref 8.6–10.2)
Chloride: 102 mmol/L (ref 96–106)
Creatinine, Ser: 0.95 mg/dL (ref 0.76–1.27)
Globulin, Total: 3.4 g/dL (ref 1.5–4.5)
Glucose: 95 mg/dL (ref 70–99)
Potassium: 4 mmol/L (ref 3.5–5.2)
Sodium: 136 mmol/L (ref 134–144)
Total Protein: 7.2 g/dL (ref 6.0–8.5)
eGFR: 88 mL/min/1.73 (ref >59)

## 2024-05-05 LAB — TSH: TSH: 1.17 u[IU]/mL (ref 0.450–4.500)

## 2024-05-05 LAB — FERRITIN: Ferritin: 65 ng/mL (ref 30–400)

## 2024-05-05 LAB — B12 AND FOLATE PANEL
Folate: 15.1 ng/mL (ref >3.0)
Vitamin B-12: 776 pg/mL (ref 232–1245)

## 2024-05-05 NOTE — Patient Instructions (Signed)
  [name] , Thank you for taking time to come for your Medicare Wellness Visit. I appreciate your ongoing commitment to your health goals. Please review the following plan we discussed and let me know if I can assist you in the future.   These are the goals we discussed:  Goals       Patient Stated (pt-stated)      Continue maintaining a healthy lifestyle.      Patient Stated (pt-stated)      Patient stated that he would like to stay active for the grand kids.      Patient Stated      Patient states he would like to get his strength back.       Quit Smoking        This is a list of the screening recommended for you and due dates:  Health Maintenance  Topic Date Due   COVID-19 Vaccine (5 - 2025-26 season) 05/20/2024*   Cologuard (Stool DNA test)  12/11/2024   Medicare Annual Wellness Visit  05/05/2025   DTaP/Tdap/Td vaccine (2 - Td or Tdap) 04/21/2026   Pneumococcal Vaccine for age over 65  Completed   Flu Shot  Completed   Hepatitis C Screening  Completed   Zoster (Shingles) Vaccine  Completed   HPV Vaccine  Aged Out   Meningitis B Vaccine  Aged Out  *Topic was postponed. The date shown is not the original due date.

## 2024-05-05 NOTE — Progress Notes (Signed)
 Subjective:   Jordan B Churchwell Sr. is a 68 y.o. male who presents for Medicare Annual/Subsequent preventive examination.  Visit Complete: Virtual I connected with  Jordan B Farrior Sr. on 05/05/24 by a audio enabled telemedicine application and verified that I am speaking with the correct person using two identifiers.  Patient Location: Home  Provider Location: Office/Clinic  I discussed the limitations of evaluation and management by telemedicine. The patient expressed understanding and agreed to proceed.  Vital Signs: Because this visit was a virtual/telehealth visit, some criteria may be missing or patient reported. Any vitals not documented were not able to be obtained and vitals that have been documented are patient reported.  Patient Medicare AWV questionnaire was completed by the patient on 05/04/2024; I have confirmed that all information answered by patient is correct and no changes since this date.  Cardiac Risk Factors include: advanced age (>5men, >45 women);male gender;hypertension;dyslipidemia;smoking/ tobacco exposure     Objective:    Today's Vitals   05/05/24 0855  Weight: 168 lb (76.2 kg)  Height: 6' 3 (1.905 m)   Body mass index is 21 kg/m.     05/05/2024    9:03 AM 04/27/2023   10:04 AM 04/21/2022   10:11 AM 04/21/2016    3:00 PM 04/16/2016    7:17 PM  Advanced Directives  Does Patient Have a Medical Advance Directive? No No No No  No   Would patient like information on creating a medical advance directive? No - Patient declined No - Patient declined No - Patient declined No - patient declined information  Yes - Educational materials given      Data saved with a previous flowsheet row definition    Current Medications (verified) Outpatient Encounter Medications as of 05/05/2024  Medication Sig   atorvastatin  (LIPITOR) 40 MG tablet TAKE 1 TABLET(40 MG) BY MOUTH DAILY   escitalopram  (LEXAPRO ) 20 MG tablet Take 20mg  daily for anxiety.   finasteride   (PROSCAR ) 5 MG tablet Take 5 mg by mouth daily.   lisinopril -hydrochlorothiazide  (ZESTORETIC ) 20-12.5 MG tablet Take 1 tablet by mouth daily.   Multiple Vitamin (MULTIVITAMIN WITH MINERALS) TABS tablet Take 1 tablet by mouth daily.   pantoprazole  (PROTONIX ) 40 MG tablet Take 1 tablet (40 mg total) by mouth daily.   tamsulosin  (FLOMAX ) 0.4 MG CAPS capsule Take 0.4 mg by mouth 2 (two) times daily.   hydrOXYzine  (ATARAX ) 25 MG tablet Take 1 tablet (25 mg total) by mouth every 8 (eight) hours as needed for anxiety. (Patient not taking: Reported on 05/05/2024)   meloxicam  (MOBIC ) 15 MG tablet TAKE ONE TAB BY MOUTH EVERY 24 HOURS WITH A MEAL FOR 2 WEEKS, THEN ONCE DAILY AS NEEDED FOR PAIN (Patient not taking: Reported on 05/05/2024)   No facility-administered encounter medications on file as of 05/05/2024.    Allergies (verified) Patient has no known allergies.   History: Past Medical History:  Diagnosis Date   HLD (hyperlipidemia) 06/15/2018   Hypertension    Past Surgical History:  Procedure Laterality Date   Arm surgery     arthroscopic knee surgery     Family History  Problem Relation Age of Onset   Diabetes Mother    Hypertension Mother    Hypertension Father    Social History   Socioeconomic History   Marital status: Married    Spouse name: Jacqueline K Allender   Number of children: 3   Years of education: 16   Highest education level: Bachelor's degree (e.g., BA, AB, BS)  Occupational History    Comment: Retired  Tobacco Use   Smoking status: Some Days    Types: Cigars   Smokeless tobacco: Current   Tobacco comments:    Occasional use of cigars  Vaping Use   Vaping status: Never Used  Substance and Sexual Activity   Alcohol use: Yes    Alcohol/week: 1.0 standard drink of alcohol    Types: 1 Cans of beer per week    Comment: Occasional   Drug use: No   Sexual activity: Not Currently  Other Topics Concern   Not on file  Social History Narrative   Lives with his  wife. Enjoys fishing, playing golf.   Social Drivers of Corporate investment banker Strain: Low Risk  (05/05/2024)   Overall Financial Resource Strain (CARDIA)    Difficulty of Paying Living Expenses: Not hard at all  Food Insecurity: No Food Insecurity (05/05/2024)   Hunger Vital Sign    Worried About Running Out of Food in the Last Year: Never true    Ran Out of Food in the Last Year: Never true  Transportation Needs: No Transportation Needs (05/05/2024)   PRAPARE - Administrator, Civil Service (Medical): No    Lack of Transportation (Non-Medical): No  Physical Activity: Inactive (05/05/2024)   Exercise Vital Sign    Days of Exercise per Week: 0 days    Minutes of Exercise per Session: 0 min  Stress: No Stress Concern Present (05/05/2024)   [employer] of Occupational Health - Occupational Stress Questionnaire    Feeling of Stress: Not at all  Recent Concern: Stress - Stress Concern Present (05/03/2024)   [employer] of Occupational Health - Occupational Stress Questionnaire    Feeling of Stress: To some extent  Social Connections: Socially Integrated (05/05/2024)   Social Connection and Isolation Panel    Frequency of Communication with Friends and Family: More than three times a week    Frequency of Social Gatherings with Friends and Family: Twice a week    Attends Religious Services: More than 4 times per year    Active Member of [employer] or Organizations: Yes    Attends Engineer, structural: More than 4 times per year    Marital Status: Married    Tobacco Counseling Ready to quit: Not Answered Counseling given: Not Answered Tobacco comments: Occasional use of cigars   Clinical Intake:  Pre-visit preparation completed: Yes  Pain : No/denies pain     BMI - recorded: 21 Nutritional Status: BMI of 19-24  Normal Nutritional Risks: None Diabetes: No  How often do you need to have someone help you when you read instructions, pamphlets, or  other written materials from your doctor or pharmacy?: 1 - Never What is the last grade level you completed in school?: 16  Interpreter Needed?: No      Activities of Daily Living    05/05/2024    8:57 AM 05/04/2024    8:01 PM  In your present state of health, do you have any difficulty performing the following activities:  Hearing? 0 0  Vision? 0 0  Difficulty concentrating or making decisions? 0 0  Walking or climbing stairs? 0 0  Dressing or bathing? 0 0  Doing errands, shopping? 0 0  Preparing Food and eating ? N N  Using the Toilet? N N  In the past six months, have you accidently leaked urine? N   Do you have problems with loss of bowel control? N  Managing your Medications? N N  Managing your Finances? N N  Housekeeping or managing your Housekeeping? N N    Patient Care Team: Jordan Bring, DO as PCP - General (Family Medicine) Tanda Norleen HERO, MD as Referring Physician (Urology)  Indicate any recent Medical Services you may have received from other than [facility] providers in the past year (date may be approximate).     Assessment:   This is a routine wellness examination for Jordan Coffey.  Hearing/Vision screen No results found.   Goals Addressed             This Visit's Progress    Patient Stated       Patient states he would like to get his strength back.        Depression Screen    05/05/2024    9:02 AM 11/03/2023   10:38 AM 04/27/2023   10:04 AM 11/25/2022   10:11 AM 04/21/2022   10:15 AM 11/22/2021   10:46 AM 02/13/2021   10:18 AM  PHQ 2/9 Scores  PHQ - 2 Score 0 2 1 0 0 0 0  PHQ- 9 Score  11         Fall Risk    05/05/2024    9:04 AM 05/04/2024    8:01 PM 04/26/2024    2:17 PM 11/03/2023   10:38 AM 04/27/2023   10:04 AM  Fall Risk   Falls in the past year? 0 0 0 0 0  Number falls in past yr: 0   0 0  Injury with Fall? 0   0 0  Risk for fall due to : No Fall Risks   No Fall Risks No Fall Risks  Follow up Falls evaluation completed   Falls  evaluation completed Falls evaluation completed    MEDICARE RISK AT HOME: Medicare Risk at Home Any stairs in or around the home?: No If so, are there any without handrails?: No Home free of loose throw rugs in walkways, pet beds, electrical cords, etc?: Yes Adequate lighting in your home to reduce risk of falls?: Yes Life alert?: No Use of a cane, walker or w/c?: No Grab bars in the bathroom?: Yes Shower chair or bench in shower?: No Elevated toilet seat or a handicapped toilet?: Yes  TIMED UP AND GO:  Was the test performed?  No    Cognitive Function:        05/05/2024    9:04 AM 04/27/2023   10:11 AM 04/21/2022   10:19 AM  6CIT Screen  What Year? 0 points 0 points 0 points  What month? 0 points 0 points 0 points  What time? 0 points 0 points 0 points  Count back from 20 0 points 0 points 0 points  Months in reverse 0 points 0 points 0 points  Repeat phrase 2 points 2 points 2 points  Total Score 2 points 2 points 2 points    Immunizations Immunization History  Administered Date(s) Administered   Fluad Trivalent(High Dose 65+) 05/18/2023   INFLUENZA, HIGH DOSE SEASONAL PF 05/04/2024   Influenza,inj,Quad PF,6+ Mos 04/21/2016, 06/24/2017, 06/15/2018, 04/19/2019, 07/02/2020, 05/26/2022   PFIZER(Purple Top)SARS-COV-2 Vaccination 10/25/2019, 11/15/2019, 05/11/2020   PNEUMOCOCCAL CONJUGATE-20 05/04/2024   Pfizer Covid-19 Vaccine Bivalent Booster 22yrs & up 02/27/2022   Tdap 04/21/2016   Zoster Recombinant(Shingrix) 08/29/2019, 02/27/2022    TDAP status: Up to date  Flu Vaccine status: Up to date  Pneumococcal vaccine status: Up to date  Covid-19 vaccine status: Information provided on how to obtain  vaccines.   Qualifies for Shingles Vaccine? Yes   Zostavax completed No   Shingrix Completed?: Yes  Screening Tests Health Maintenance  Topic Date Due   COVID-19 Vaccine (5 - 2025-26 season) 05/20/2024 (Originally 04/11/2024)   Fecal DNA (Cologuard)  12/11/2024    Medicare Annual Wellness (AWV)  05/05/2025   DTaP/Tdap/Td (2 - Td or Tdap) 04/21/2026   Pneumococcal Vaccine: 50+ Years  Completed   Influenza Vaccine  Completed   Hepatitis C Screening  Completed   Zoster Vaccines- Shingrix  Completed   HPV VACCINES  Aged Out   Meningococcal B Vaccine  Aged Out    Health Maintenance  There are no preventive care reminders to display for this patient.   Colorectal cancer screening: Type of screening: Cologuard. Completed 12/11/2021. Repeat every 3 years  Lung Cancer Screening: (Low Dose CT Chest recommended if Age 48-80 years, 20 pack-year currently smoking OR have quit w/in 15years.) does not qualify.   Lung Cancer Screening Referral: n/a  Additional Screening:  Hepatitis C Screening: does qualify; Completed 06/24/2017  Vision Screening: Recommended annual ophthalmology exams for early detection of glaucoma and other disorders of the eye. Is the patient up to date with their annual eye exam?  No  Who is the provider or what is the name of the office in which the patient attends annual eye exams?  If pt is not established with a provider, would they like to be referred to a provider to establish care? No .   Dental Screening: Recommended annual dental exams for proper oral hygiene   Community Resource Referral / Chronic Care Management: CRR required this visit?  No   CCM required this visit?  No     Plan:     I have personally reviewed and noted the following in the patient's chart:   Medical and social history Use of alcohol, tobacco or illicit drugs  Current medications and supplements including opioid prescriptions. Patient is not currently taking opioid prescriptions. Functional ability and status Nutritional status Physical activity Advanced directives List of other physicians Hospitalizations, surgeries, and ER visits in previous 12 months. None Vitals Screenings to include cognitive, depression, and falls Referrals and  appointments  In addition, I have reviewed and discussed with patient certain preventive protocols, quality metrics, and best practice recommendations. A written personalized care plan for preventive services as well as general preventive health recommendations were provided to patient.     Bonny Jon Mayor, CMA   05/05/2024   After Visit Summary: (MyChart) Due to this being a telephonic visit, the after visit summary with patients personalized plan was offered to patient via MyChart   Nurse Notes:   Jordan NOVAK Dehn Sr. is a 68 y.o. male patient of Jordan Bring, DO who had a Medicare Annual Wellness Visit today via telephone. Jordan Coffey is Retired and lives with their spouse. He has 3 children. He reports that he is socially active and does interact with friends/family regularly. He is moderately physically active and enjoys fishing and playing golf.

## 2024-05-10 ENCOUNTER — Other Ambulatory Visit: Payer: Self-pay | Admitting: Family Medicine

## 2024-05-10 ENCOUNTER — Other Ambulatory Visit: Payer: Self-pay | Admitting: Gastroenterology

## 2024-05-10 DIAGNOSIS — R6881 Early satiety: Secondary | ICD-10-CM | POA: Diagnosis not present

## 2024-05-10 DIAGNOSIS — K2930 Chronic superficial gastritis without bleeding: Secondary | ICD-10-CM | POA: Diagnosis not present

## 2024-05-10 DIAGNOSIS — K2980 Duodenitis without bleeding: Secondary | ICD-10-CM | POA: Diagnosis not present

## 2024-05-10 DIAGNOSIS — R109 Unspecified abdominal pain: Secondary | ICD-10-CM

## 2024-05-10 DIAGNOSIS — R1084 Generalized abdominal pain: Secondary | ICD-10-CM | POA: Diagnosis not present

## 2024-05-10 DIAGNOSIS — I10 Essential (primary) hypertension: Secondary | ICD-10-CM

## 2024-05-10 DIAGNOSIS — K3189 Other diseases of stomach and duodenum: Secondary | ICD-10-CM | POA: Diagnosis not present

## 2024-05-10 DIAGNOSIS — K2970 Gastritis, unspecified, without bleeding: Secondary | ICD-10-CM | POA: Diagnosis not present

## 2024-05-10 DIAGNOSIS — K449 Diaphragmatic hernia without obstruction or gangrene: Secondary | ICD-10-CM | POA: Diagnosis not present

## 2024-05-10 DIAGNOSIS — K219 Gastro-esophageal reflux disease without esophagitis: Secondary | ICD-10-CM | POA: Diagnosis not present

## 2024-05-11 ENCOUNTER — Other Ambulatory Visit: Payer: Self-pay

## 2024-05-11 MED ORDER — ESCITALOPRAM OXALATE 20 MG PO TABS: ORAL_TABLET | ORAL | 1 refills | Status: AC

## 2024-05-12 DIAGNOSIS — K2980 Duodenitis without bleeding: Secondary | ICD-10-CM | POA: Diagnosis not present

## 2024-05-12 DIAGNOSIS — K2930 Chronic superficial gastritis without bleeding: Secondary | ICD-10-CM | POA: Diagnosis not present

## 2024-05-16 ENCOUNTER — Ambulatory Visit: Payer: Self-pay | Admitting: Family Medicine

## 2024-05-26 ENCOUNTER — Ambulatory Visit
Admission: RE | Admit: 2024-05-26 | Discharge: 2024-05-26 | Disposition: A | Discharge status: Home / Self Care | Source: Ambulatory Visit | Attending: Gastroenterology | Admitting: Gastroenterology

## 2024-05-26 DIAGNOSIS — R109 Unspecified abdominal pain: Secondary | ICD-10-CM

## 2024-05-26 DIAGNOSIS — R6881 Early satiety: Secondary | ICD-10-CM

## 2024-05-26 DIAGNOSIS — N400 Enlarged prostate without lower urinary tract symptoms: Secondary | ICD-10-CM | POA: Diagnosis not present

## 2024-05-26 MED ORDER — IOPAMIDOL (ISOVUE-370) INJECTION 76%
75.0000 mL | Freq: Once | INTRAVENOUS | Status: AC | PRN
  Administered 2024-05-26: 75 mL via INTRAVENOUS

## 2024-06-17 ENCOUNTER — Other Ambulatory Visit: Payer: Self-pay

## 2024-06-17 DIAGNOSIS — N401 Enlarged prostate with lower urinary tract symptoms: Secondary | ICD-10-CM

## 2024-06-17 MED ORDER — FINASTERIDE 5 MG PO TABS
5.0000 mg | ORAL_TABLET | Freq: Every day | ORAL | 1 refills | Status: AC
Start: 2024-06-17 — End: ?

## 2024-08-08 ENCOUNTER — Other Ambulatory Visit: Payer: Self-pay | Admitting: Family Medicine

## 2024-08-08 DIAGNOSIS — I10 Essential (primary) hypertension: Secondary | ICD-10-CM

## 2025-05-09 ENCOUNTER — Ambulatory Visit
